# Patient Record
Sex: Male | Born: 1995
Health system: Southern US, Community
[De-identification: ages and names within clinical notes are randomized; demographics above are authoritative.]

## PROBLEM LIST (undated history)

## (undated) ENCOUNTER — Ambulatory Visit: Source: Home / Self Care

## (undated) ENCOUNTER — Ambulatory Visit (HOSPITAL_COMMUNITY): Admission: EM | Payer: Self-pay | Source: Home / Self Care

## (undated) DIAGNOSIS — F419 Anxiety disorder, unspecified: Secondary | ICD-10-CM

## (undated) DIAGNOSIS — F909 Attention-deficit hyperactivity disorder, unspecified type: Secondary | ICD-10-CM

## (undated) DIAGNOSIS — F319 Bipolar disorder, unspecified: Secondary | ICD-10-CM

## (undated) HISTORY — PX: HIP SURGERY: SHX245

---
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
## 2015-02-09 ENCOUNTER — Emergency Department (HOSPITAL_COMMUNITY): Payer: Medicaid Other

## 2015-02-09 ENCOUNTER — Emergency Department (HOSPITAL_COMMUNITY)
Admission: EM | Admit: 2015-02-09 | Discharge: 2015-02-09 | Disposition: A | Discharge status: Home / Self Care | Payer: Medicaid Other | Attending: Emergency Medicine | Admitting: Emergency Medicine

## 2015-02-09 ENCOUNTER — Encounter (HOSPITAL_COMMUNITY): Payer: Self-pay | Admitting: *Deleted

## 2015-02-09 DIAGNOSIS — F909 Attention-deficit hyperactivity disorder, unspecified type: Secondary | ICD-10-CM | POA: Diagnosis not present

## 2015-02-09 DIAGNOSIS — S3993XA Unspecified injury of pelvis, initial encounter: Secondary | ICD-10-CM | POA: Diagnosis not present

## 2015-02-09 DIAGNOSIS — Y998 Other external cause status: Secondary | ICD-10-CM | POA: Diagnosis not present

## 2015-02-09 DIAGNOSIS — Y92410 Unspecified street and highway as the place of occurrence of the external cause: Secondary | ICD-10-CM | POA: Diagnosis not present

## 2015-02-09 DIAGNOSIS — R451 Restlessness and agitation: Secondary | ICD-10-CM | POA: Insufficient documentation

## 2015-02-09 DIAGNOSIS — S199XXA Unspecified injury of neck, initial encounter: Secondary | ICD-10-CM | POA: Diagnosis present

## 2015-02-09 DIAGNOSIS — F419 Anxiety disorder, unspecified: Secondary | ICD-10-CM | POA: Diagnosis not present

## 2015-02-09 DIAGNOSIS — Z79899 Other long term (current) drug therapy: Secondary | ICD-10-CM | POA: Diagnosis not present

## 2015-02-09 DIAGNOSIS — Y9389 Activity, other specified: Secondary | ICD-10-CM | POA: Diagnosis not present

## 2015-02-09 DIAGNOSIS — F17210 Nicotine dependence, cigarettes, uncomplicated: Secondary | ICD-10-CM | POA: Diagnosis not present

## 2015-02-09 DIAGNOSIS — S161XXA Strain of muscle, fascia and tendon at neck level, initial encounter: Secondary | ICD-10-CM | POA: Diagnosis not present

## 2015-02-09 MED ORDER — NAPROXEN 500 MG PO TABS: ORAL_TABLET | ORAL | Status: DC

## 2015-02-09 MED ORDER — ONDANSETRON 4 MG PO TBDP
4.0000 mg | ORAL_TABLET | Freq: Once | ORAL | Status: AC
  Administered 2015-02-09: 4 mg via ORAL
  Filled 2015-02-09: qty 1

## 2015-02-09 MED ORDER — HYDROXYZINE HCL 25 MG PO TABS: 25.0000 mg | ORAL_TABLET | Freq: Three times a day (TID) | ORAL | Status: DC | PRN

## 2015-02-09 MED ORDER — HYDROXYZINE HCL 25 MG PO TABS
25.0000 mg | ORAL_TABLET | Freq: Once | ORAL | Status: AC
  Administered 2015-02-09: 25 mg via ORAL
  Filled 2015-02-09: qty 1

## 2015-02-09 NOTE — Discharge Instructions (Signed)
Follow up as needed
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
## 2016-04-14 ENCOUNTER — Emergency Department (HOSPITAL_COMMUNITY)
Admission: EM | Admit: 2016-04-14 | Discharge: 2016-04-14 | Disposition: A | Discharge status: Home / Self Care | Payer: Medicaid Other | Attending: Emergency Medicine | Admitting: Emergency Medicine

## 2016-04-14 ENCOUNTER — Encounter (HOSPITAL_COMMUNITY): Payer: Self-pay | Admitting: Emergency Medicine

## 2016-04-14 DIAGNOSIS — F909 Attention-deficit hyperactivity disorder, unspecified type: Secondary | ICD-10-CM | POA: Insufficient documentation

## 2016-04-14 DIAGNOSIS — R519 Headache, unspecified: Secondary | ICD-10-CM

## 2016-04-14 DIAGNOSIS — R110 Nausea: Secondary | ICD-10-CM

## 2016-04-14 DIAGNOSIS — F17210 Nicotine dependence, cigarettes, uncomplicated: Secondary | ICD-10-CM | POA: Insufficient documentation

## 2016-04-14 DIAGNOSIS — J45909 Unspecified asthma, uncomplicated: Secondary | ICD-10-CM | POA: Insufficient documentation

## 2016-04-14 DIAGNOSIS — Z79899 Other long term (current) drug therapy: Secondary | ICD-10-CM | POA: Insufficient documentation

## 2016-04-14 DIAGNOSIS — R51 Headache: Secondary | ICD-10-CM | POA: Insufficient documentation

## 2016-04-14 HISTORY — DX: Anxiety disorder, unspecified: F41.9

## 2016-04-14 MED ORDER — KETOROLAC TROMETHAMINE 15 MG/ML IJ SOLN
30.0000 mg | Freq: Once | INTRAMUSCULAR | Status: AC
  Administered 2016-04-14: 30 mg via INTRAMUSCULAR
  Filled 2016-04-14: qty 2

## 2016-04-14 MED ORDER — METOCLOPRAMIDE HCL 10 MG PO TABS
10.0000 mg | ORAL_TABLET | Freq: Once | ORAL | Status: AC
  Administered 2016-04-14: 10 mg via ORAL
  Filled 2016-04-14: qty 1

## 2016-04-14 MED ORDER — ONDANSETRON HCL 4 MG PO TABS: 4.0000 mg | ORAL_TABLET | Freq: Four times a day (QID) | ORAL | 0 refills | Status: DC

## 2016-04-14 MED ORDER — ALBUTEROL SULFATE HFA 108 (90 BASE) MCG/ACT IN AERS
1.0000 | INHALATION_SPRAY | RESPIRATORY_TRACT | Status: DC | PRN
  Administered 2016-04-14: 2 via RESPIRATORY_TRACT
  Filled 2016-04-14: qty 6.7

## 2016-04-14 MED ORDER — DIPHENHYDRAMINE HCL 25 MG PO CAPS
25.0000 mg | ORAL_CAPSULE | Freq: Once | ORAL | Status: AC
  Administered 2016-04-14: 25 mg via ORAL
  Filled 2016-04-14: qty 1

## 2016-04-14 NOTE — ED Provider Notes (Signed)
[facility] DEPT Provider Note   CSN: [id number] Arrival date & time: 04/14/16  1504  By signing my name below, I, Zachary [name], attest that this documentation has been prepared under the direction and in the presence of [employer], PA-C. Electronically Signed: Sonum [name], Education administrator. 04/14/16. 4:02 PM.  History   Chief Complaint Chief Complaint  Patient presents with  . Headache  . Nausea    The history is provided by the patient. No language interpreter was used.     HPI Comments: Zachary [name] is a 21 y.o. male who presents to the Emergency Department complaining of a constant, unchanged frontal and left sided HA that began after waking this morning. He has associated nausea and a mild cough that started a few days ago. He reports history of similar headaches but states the last one was over 1 year ago. He denies sick contacts. He denies numbness, paresthesia, fever, vomiting, congestion, abdominal pain, diarrhea.   Past Medical History:  Diagnosis Date  . Anxiety   . 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   Past Surgical History:  Procedure Laterality Date  . HIP SURGERY         Home Medications    Prior to Admission medications   Medication Sig Start Date End Date Taking? Authorizing Provider  fexofenadine (ALLEGRA) 180 MG tablet Take 1 tablet (180 mg total) by mouth daily. Patient not taking: Reported on 02/09/2015 06/05/12   Billy Fischer, MD  hydrOXYzine (ATARAX/VISTARIL) 25 MG tablet Take 1 tablet (25 mg total) by mouth every 8 (eight) hours as needed for anxiety. 02/09/15   Milton Ferguson, MD  methylphenidate (CONCERTA) 36 MG CR tablet Take 36 mg by mouth every morning.      Historical Provider, MD  naproxen (NAPROSYN) 500 MG tablet Take one every 12 hours for pain 02/09/15   Milton Ferguson, MD  ondansetron (ZOFRAN) 4 MG tablet Take 1 tablet (4 mg total) by mouth every 6 (six)  hours. 04/14/16   Okey Regal, PA-C    Family History History reviewed. No pertinent family history.  Social History Social History  Substance Use Topics  . Smoking status: Current Some Day Smoker    Packs/day: 1.00    Types: Cigarettes  . Smokeless tobacco: Not on file  . Alcohol use Yes     Allergies   Patient has no known allergies.   Review of Systems Review of Systems  Constitutional: Negative for fever.  HENT: Negative for congestion.   Gastrointestinal: Positive for nausea. Negative for abdominal pain, diarrhea and vomiting.  Neurological: Positive for headaches. Negative for weakness and numbness.     Physical Exam Updated Vital Signs BP 126/71 (BP Location: Right Arm)   Pulse (!) 56   Temp 97.6 F (36.4 C) (Oral)   Resp 20   Wt 89.4 kg   SpO2 98%   BMI 29.09 kg/m   Physical Exam  Constitutional: He is oriented to person, place, and time. He appears well-developed and well-nourished. No distress.  HENT:  Head: Normocephalic.  Eyes: Conjunctivae and EOM are normal. Pupils are equal, round, and reactive to light. Right eye exhibits no discharge. Left eye exhibits no discharge. No scleral icterus.  Neck: Normal range of motion. Neck supple. No JVD present.  Cardiovascular: Normal rate.   Pulmonary/Chest: Effort normal and breath sounds normal. No stridor. No respiratory distress. He has no wheezes. He has no rales.  Abdominal: Soft. He exhibits no distension. There is no tenderness.  Musculoskeletal: Normal range of motion. He exhibits no edema or tenderness.  Lymphadenopathy:    He has no cervical adenopathy.  Neurological: He is alert and oriented to person, place, and time. He has normal strength. He displays no atrophy and no tremor. No cranial nerve deficit or sensory deficit. He exhibits normal muscle tone. He displays a negative Romberg sign. He displays no seizure activity. Coordination and gait normal. GCS eye subscore is 4. GCS verbal subscore is  5. GCS motor subscore is 6.  Reflex Scores:      Patellar reflexes are 2+ on the right side and 2+ on the left side. Skin: He is not diaphoretic.  Nursing note and vitals reviewed.    ED Treatments / Results  DIAGNOSTIC STUDIES: Oxygen Saturation is 100% on RA, normal by my interpretation.    COORDINATION OF CARE: 3:46 PM Discussed treatment plan with pt at bedside and pt agreed to plan.   Labs (all labs ordered are listed, but only abnormal results are displayed) Labs Reviewed - No data to display  EKG  EKG Interpretation None       Radiology No results found.  Procedures Procedures (including critical care time)  Medications Ordered in ED Medications  ketorolac (TORADOL) 15 MG/ML injection 30 mg (30 mg Intramuscular Given 04/14/16 1608)  metoCLOPramide (REGLAN) tablet 10 mg (10 mg Oral Given 04/14/16 1628)  diphenhydrAMINE (BENADRYL) capsule 25 mg (25 mg Oral Given 04/14/16 1608)     Initial Impression / Assessment and Plan / ED Course  I have reviewed the triage vital signs and the nursing notes.  Pertinent labs & imaging results that were available during my care of the patient were reviewed by me and considered in my medical decision making (see chart for details).     Patient presents with headache and nausea.  He is afebrile nontoxic, normal neuro exam, no red flags for headache here.  He was treated with near complete symptom resolution.  Patient will be discharged home with primary care follow-up if symptoms continue to persist.  Patient given return precautions in the event of new or worsening signs or symptoms.  He verbalized understanding and agreement to today's plan.  Patient also notes a history of asthma, no acute asthma symptoms here today, requesting albuterol as he does not have access to the prescription.  Inhaler given here in the ED  Final Clinical Impressions(s) / ED Diagnoses   Final diagnoses:  Nonintractable headache, unspecified chronicity  pattern, unspecified headache type  Nausea  Uncomplicated asthma, unspecified asthma severity, unspecified whether persistent    New Prescriptions Discharge Medication List as of 04/14/2016  5:01 PM    START taking these medications   Details  ondansetron (ZOFRAN) 4 MG tablet Take 1 tablet (4 mg total) by mouth every 6 (six) hours., Starting Sat 04/14/2016, Print       I personally performed the services described in this documentation, which was scribed in my presence. The recorded information has been reviewed and is accurate.    Okey Regal, PA-C 04/14/16 2025    Drenda Freeze, MD 04/14/16 [phone]

## 2016-04-14 NOTE — ED Triage Notes (Signed)
Per EMS-pt called for transport with c/o headache , nausea. Denies fever. Denies blurred vision

## 2016-04-14 NOTE — ED Triage Notes (Signed)
Pt reports nausea, headache and anxiety since this morning. Did not self medicate

## 2016-04-14 NOTE — Discharge Instructions (Signed)
Please read attached information. If you experience any new or worsening signs or symptoms please return to the emergency room for evaluation. Please follow-up with your primary care provider or specialist as discussed. Please use medication prescribed only as directed and discontinue taking if you have any concerning signs or symptoms.   °

## 2016-06-01 ENCOUNTER — Emergency Department (HOSPITAL_COMMUNITY): Payer: Self-pay

## 2016-06-01 ENCOUNTER — Emergency Department (HOSPITAL_COMMUNITY)
Admission: EM | Admit: 2016-06-01 | Discharge: 2016-06-01 | Disposition: A | Discharge status: Home / Self Care | Payer: Self-pay | Attending: Emergency Medicine | Admitting: Emergency Medicine

## 2016-06-01 ENCOUNTER — Encounter (HOSPITAL_COMMUNITY): Payer: Self-pay | Admitting: Vascular Surgery

## 2016-06-01 DIAGNOSIS — F17210 Nicotine dependence, cigarettes, uncomplicated: Secondary | ICD-10-CM | POA: Insufficient documentation

## 2016-06-01 DIAGNOSIS — R05 Cough: Secondary | ICD-10-CM

## 2016-06-01 DIAGNOSIS — R059 Cough, unspecified: Secondary | ICD-10-CM

## 2016-06-01 DIAGNOSIS — B009 Herpesviral infection, unspecified: Secondary | ICD-10-CM | POA: Insufficient documentation

## 2016-06-01 DIAGNOSIS — Z79899 Other long term (current) drug therapy: Secondary | ICD-10-CM | POA: Insufficient documentation

## 2016-06-01 LAB — URINALYSIS, ROUTINE W REFLEX MICROSCOPIC
Bacteria, UA: NONE SEEN
Bilirubin Urine: NEGATIVE
Glucose, UA: NEGATIVE mg/dL
Hgb urine dipstick: NEGATIVE
Ketones, ur: NEGATIVE mg/dL
Nitrite: NEGATIVE
Protein, ur: NEGATIVE mg/dL
Specific Gravity, Urine: 1.021 (ref 1.005–1.030)
Squamous Epithelial / LPF: NONE SEEN
pH: 6 (ref 5.0–8.0)

## 2016-06-01 MED ORDER — STERILE WATER FOR INJECTION IJ SOLN
INTRAMUSCULAR | Status: AC
  Filled 2016-06-01: qty 10

## 2016-06-01 MED ORDER — AZITHROMYCIN 250 MG PO TABS
1000.0000 mg | ORAL_TABLET | Freq: Once | ORAL | Status: AC
  Administered 2016-06-01: 1000 mg via ORAL
  Filled 2016-06-01: qty 4

## 2016-06-01 MED ORDER — CEFTRIAXONE SODIUM 250 MG IJ SOLR
250.0000 mg | Freq: Once | INTRAMUSCULAR | Status: AC
  Administered 2016-06-01: 250 mg via INTRAMUSCULAR
  Filled 2016-06-01: qty 250

## 2016-06-01 MED ORDER — VALACYCLOVIR HCL 500 MG PO TABS
1000.0000 mg | ORAL_TABLET | Freq: Two times a day (BID) | ORAL | Status: DC
  Administered 2016-06-01: 1000 mg via ORAL
  Filled 2016-06-01 (×2): qty 2

## 2016-06-01 MED ORDER — VALACYCLOVIR HCL 1 G PO TABS: 1000.0000 mg | ORAL_TABLET | Freq: Three times a day (TID) | ORAL | 0 refills | Status: DC

## 2016-06-01 MED ORDER — BENZONATATE 100 MG PO CAPS: 100.0000 mg | ORAL_CAPSULE | Freq: Three times a day (TID) | ORAL | 0 refills | Status: DC

## 2016-06-01 MED ORDER — ALBUTEROL SULFATE HFA 108 (90 BASE) MCG/ACT IN AERS
1.0000 | INHALATION_SPRAY | Freq: Once | RESPIRATORY_TRACT | Status: AC
  Administered 2016-06-01: 2 via RESPIRATORY_TRACT
  Filled 2016-06-01: qty 6.7

## 2016-06-01 MED FILL — ?VALACYCLOVIR HCL 1 GRAM TA: 1 | 10 days supply | Qty: 30 | Fill #0

## 2016-06-01 MED FILL — BENZONATATE 100 MG CAPSULE: 100 | 7 days supply | Qty: 21 | Fill #0

## 2016-06-01 NOTE — ED Notes (Signed)
Pt given guaze to cover wounds.

## 2016-06-01 NOTE — ED Triage Notes (Signed)
Pt reports to the ED for eval of multiple complaints. Pt reports he has had a cold with a productive green cough. He has hx of asthma and cannot afford his inhaler. He also reports a rash to the perineal area. He reports multiple partner with unprotected sexual intercourse. He also has some lymph node swelling to the groin.

## 2016-06-01 NOTE — Discharge Instructions (Signed)
Medications: Valtrex, Tessalon  Treatment: Take Valtrex twice daily for 10 days. Take Tessalon every 8 hours as needed for cough. You have already been treated for gonorrhea and Chlamydia. If you test positive for HIV or syphilis follow-up at the health department for treatment. If positive for any sexual transmitted disease, please tell all of your sexual partners that they will need to be treated as well.  Follow-up: Please follow-up with Dr. Veverly Fells for further evaluation and treatment of your hip pain.

## 2016-06-01 NOTE — ED Notes (Signed)
Patient transported to X-ray 

## 2016-06-01 NOTE — ED Provider Notes (Signed)
[facility] DEPT [facility] Provider Note   CSN: [id number] Arrival date & time: 06/01/16  5170  By signing my name below, I, [name], attest that this documentation has been prepared under the direction and in the presence of [employer] . Electronically Signed: [name], Education administrator. 06/01/16. 9:56 AM.  History   Chief Complaint Chief Complaint  Patient presents with  . Cough    The history is provided by the patient. No language interpreter was used.     HPI Comments: Zachary [name] is a 21 y.o. male with past medical history of asthma who presents to the Emergency Department complaining of a persistent cough that has been ongoing for the past 3 weeks. He states the cough is productive of green sputum. He has associated rhinorrhea and sore throat for nearly the same period of time. Patient states his symptoms come when he goes outside. He has not tried any OTC medications for his symptoms aside from an inhaler which he has run out of. He denies fever, CP, chest tightness, abdominal pain, vomiting, diarrhea. He has not been evaluated for these symptoms.   He also complains of persistent penile irritation and sores for the past few weeks. He states his boxers worsen the irritation. He notes having unprotected intercourse with multiple partners but started wearing a condom when the irritation began. He denies dysuria, penile discharge.   He further complains of intermittent bilateral hip pain that has been ongoing for the last few months. He states the pain is present only when walking. He notes having hip surgery as a child. Per medical record, patient had bilateral SCFE repair.   Past Medical History:  Diagnosis Date  . Anxiety   . 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   Past Surgical History:  Procedure Laterality Date  . HIP SURGERY         Home Medications    Prior to Admission  medications   Medication Sig Start Date End Date Taking? Authorizing Provider  benzonatate (TESSALON) 100 MG capsule Take 1 capsule (100 mg total) by mouth every 8 (eight) hours. 06/01/16   Frederica Kuster, PA-C  fexofenadine (ALLEGRA) 180 MG tablet Take 1 tablet (180 mg total) by mouth daily. Patient not taking: Reported on 02/09/2015 06/05/12   Billy Fischer, MD  hydrOXYzine (ATARAX/VISTARIL) 25 MG tablet Take 1 tablet (25 mg total) by mouth every 8 (eight) hours as needed for anxiety. 02/09/15   Milton Ferguson, MD  methylphenidate (CONCERTA) 36 MG CR tablet Take 36 mg by mouth every morning.      Historical Provider, MD  naproxen (NAPROSYN) 500 MG tablet Take one every 12 hours for pain 02/09/15   Milton Ferguson, MD  ondansetron (ZOFRAN) 4 MG tablet Take 1 tablet (4 mg total) by mouth every 6 (six) hours. 04/14/16   Okey Regal, PA-C  valACYclovir (VALTREX) 1000 MG tablet Take 1 tablet (1,000 mg total) by mouth 3 (three) times daily. 06/01/16   Frederica Kuster, PA-C    Family History No family history on file.  Social History Social History  Substance Use Topics  . Smoking status: Current Some Day Smoker    Packs/day: 1.00    Types: Cigarettes  . Smokeless tobacco: Never Used  . Alcohol use Yes     Comment: every other day     Allergies   Patient has no known allergies.   Review of  Systems Review of Systems  Constitutional: Negative for fever.  HENT: Positive for rhinorrhea and sore throat.   Respiratory: Positive for cough. Negative for chest tightness and shortness of breath.   Cardiovascular: Negative for chest pain.  Gastrointestinal: Negative for abdominal pain, diarrhea and vomiting.  Genitourinary: Positive for penile pain. Negative for discharge and dysuria.  Musculoskeletal: Positive for arthralgias.     Physical Exam Updated Vital Signs BP (!) 146/82 (BP Location: Left Arm)   Pulse 63   Temp 97.7 F (36.5 C) (Oral)   Resp 16   SpO2 99%   Physical Exam    Constitutional: He appears well-developed and well-nourished. No distress.  HENT:  Head: Normocephalic and atraumatic.  Mouth/Throat: Oropharynx is clear and moist. No oropharyngeal exudate.  Eyes: Conjunctivae are normal. Pupils are equal, round, and reactive to light. Right eye exhibits no discharge. Left eye exhibits no discharge. No scleral icterus.  Neck: Normal range of motion. Neck supple. No thyromegaly present.  Cardiovascular: Normal rate, regular rhythm, normal heart sounds and intact distal pulses.  Exam reveals no gallop and no friction rub.   No murmur heard. Pulmonary/Chest: Effort normal and breath sounds normal. No stridor. No respiratory distress. He has no wheezes. He has no rales.  Abdominal: Soft. Bowel sounds are normal. He exhibits no distension. There is no tenderness. There is no rebound and no guarding.  Genitourinary: Testes normal. Circumcised. Penile tenderness present.  Genitourinary Comments: Many large (up to 2 cm), yellow, lesions with drainage; tender  Musculoskeletal: He exhibits no edema or tenderness.  No hip tenderness bilaterally   Lymphadenopathy:    He has no cervical adenopathy.  Neurological: He is alert. Coordination normal.  Skin: Skin is warm and dry. No rash noted. He is not diaphoretic. No pallor.  Psychiatric: He has a normal mood and affect.  Nursing note and vitals reviewed.    ED Treatments / Results  DIAGNOSTIC STUDIES: Oxygen Saturation is 100% on RA, nml by my interpretation.    COORDINATION OF CARE: 10:09 AM Discussed treatment plan with pt at bedside and pt agreed to plan.   Labs (all labs ordered are listed, but only abnormal results are displayed) Labs Reviewed  URINALYSIS, ROUTINE W REFLEX MICROSCOPIC - Abnormal; Notable for the following:       Result Value   Leukocytes, UA TRACE (*)    All other components within normal limits  HSV CULTURE AND TYPING  HIV ANTIBODY (ROUTINE TESTING)  RPR  GC/CHLAMYDIA PROBE AMP  ([place]) NOT AT [facility]    EKG  EKG Interpretation None       Radiology Dg Chest 2 View  Result Date: 06/01/2016 CLINICAL DATA:  Cough EXAM: CHEST  2 VIEW COMPARISON:  06/04/2012 FINDINGS: The heart size and mediastinal contours are within normal limits. Both lungs are clear. The visualized skeletal structures are unremarkable. IMPRESSION: No active cardiopulmonary disease. Electronically Signed   By: Inez Catalina M.D.   On: 06/01/2016 11:02   Dg Pelvis 1-2 Views  Result Date: 06/01/2016 CLINICAL DATA:  Intermittent hip pain EXAM: PELVIS - 1-2 VIEW COMPARISON:  02/09/2015 FINDINGS: Compression screws are noted traversing the femoral necks bilaterally. No acute fracture or dislocation is seen. The overall appearance is similar to that noted on the prior exam. IMPRESSION: Postsurgical changes without acute abnormality. Electronically Signed   By: Inez Catalina M.D.   On: 06/01/2016 11:03    Procedures Procedures (including critical care time)  Medications Ordered in ED Medications  cefTRIAXone (ROCEPHIN)  injection 250 mg (250 mg Intramuscular Given 06/01/16 1109)  azithromycin (ZITHROMAX) tablet 1,000 mg (1,000 mg Oral Given 06/01/16 1109)  albuterol (PROVENTIL HFA;VENTOLIN HFA) 108 (90 Base) MCG/ACT inhaler 1-2 puff (2 puffs Inhalation Given 06/01/16 1112)     Initial Impression / Assessment and Plan / ED Course  I have reviewed the triage vital signs and the nursing notes.  Pertinent labs & imaging results that were available during my care of the patient were reviewed by me and considered in my medical decision making (see chart for details).     Patient with probable asthma intermittently exacerbated by allergens. CXR negative. Refilled inhaler and we'll give cough suppressant. Patient treated in the ED for STI with Rocephin, azithromycin, Valtrex. Patient advised to inform and treat all sexual partners.  Pt advised on safe sex practices and understands that they have  GC/Chlamydia, HSV cultures pending and will result in 2-3 days. HIV and RPR sent. UA shows trace leukocytes. Pt encouraged to follow up at local health department for future STI checks. No concern for prostatitis or epididymitis. Considering patient's hip pain, x-ray was ordered and showed postsurgical changes without acute abnormality. Will refer to orthopedics for further evaluation. Discussed return precautions. Patient vitals stable throughout ED course and discharged in satisfactory condition. Patient also evaluated by Dr. Stark Jock who guided the patient's management and agrees with plan.   Final Clinical Impressions(s) / ED Diagnoses   Final diagnoses:  Herpes simplex infection  Cough    New Prescriptions Discharge Medication List as of 06/01/2016 12:24 PM    START taking these medications   Details  benzonatate (TESSALON) 100 MG capsule Take 1 capsule (100 mg total) by mouth every 8 (eight) hours., Starting Fri 06/01/2016, Print    valACYclovir (VALTREX) 1000 MG tablet Take 1 tablet (1,000 mg total) by mouth 3 (three) times daily., Starting Fri 06/01/2016, Print       I personally performed the services described in this documentation, which was scribed in my presence. The recorded information has been reviewed and is accurate.    [address], PA-C 06/03/16 [place], MD 06/03/16 [phone]

## 2016-06-01 NOTE — ED Notes (Signed)
Productive cough x 3 weeks. "Sores" on "privates" x 3 weeks per pt's Mom.

## 2016-06-02 LAB — HIV ANTIBODY (ROUTINE TESTING W REFLEX): HIV Screen 4th Generation wRfx: NONREACTIVE

## 2016-06-04 LAB — GC/CHLAMYDIA PROBE AMP (~~LOC~~) NOT AT ARMC
Chlamydia: POSITIVE — AB
Neisseria Gonorrhea: NEGATIVE

## 2016-06-04 LAB — HSV CULTURE AND TYPING

## 2016-06-06 LAB — RPR: RPR Ser Ql: REACTIVE — AB

## 2016-06-06 LAB — RPR, QUANT+TP ABS (REFLEX)
Rapid Plasma Reagin, Quant: 1:32 {titer} — ABNORMAL HIGH
T Pallidum Abs: POSITIVE — AB

## 2016-06-13 ENCOUNTER — Encounter (HOSPITAL_COMMUNITY): Payer: Self-pay | Admitting: Emergency Medicine

## 2016-06-13 ENCOUNTER — Ambulatory Visit (HOSPITAL_COMMUNITY): Admission: EM | Admit: 2016-06-13 | Discharge: 2016-06-13 | Discharge status: Against Medical Advice | Payer: Self-pay

## 2016-06-13 NOTE — ED Notes (Signed)
Pt reports that he has to go to work. Pt left.
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
## 2016-07-10 ENCOUNTER — Emergency Department (HOSPITAL_COMMUNITY): Payer: Self-pay

## 2016-07-10 ENCOUNTER — Encounter (HOSPITAL_COMMUNITY): Payer: Self-pay | Admitting: Emergency Medicine

## 2016-07-10 ENCOUNTER — Emergency Department (HOSPITAL_COMMUNITY)
Admission: EM | Admit: 2016-07-10 | Discharge: 2016-07-11 | Disposition: A | Discharge status: Other Acute Inpatient Hospital | Payer: Self-pay | Attending: Emergency Medicine | Admitting: Emergency Medicine

## 2016-07-10 DIAGNOSIS — Z79899 Other long term (current) drug therapy: Secondary | ICD-10-CM | POA: Insufficient documentation

## 2016-07-10 DIAGNOSIS — Z638 Other specified problems related to primary support group: Secondary | ICD-10-CM

## 2016-07-10 DIAGNOSIS — R45851 Suicidal ideations: Secondary | ICD-10-CM

## 2016-07-10 DIAGNOSIS — Z6379 Other stressful life events affecting family and household: Secondary | ICD-10-CM | POA: Insufficient documentation

## 2016-07-10 DIAGNOSIS — F321 Major depressive disorder, single episode, moderate: Secondary | ICD-10-CM | POA: Insufficient documentation

## 2016-07-10 DIAGNOSIS — F17210 Nicotine dependence, cigarettes, uncomplicated: Secondary | ICD-10-CM | POA: Insufficient documentation

## 2016-07-10 HISTORY — DX: Bipolar disorder, unspecified: F31.9

## 2016-07-10 LAB — COMPREHENSIVE METABOLIC PANEL
ALT: 26 U/L (ref 17–63)
ANION GAP: 9 (ref 5–15)
AST: 34 U/L (ref 15–41)
Albumin: 4.2 g/dL (ref 3.5–5.0)
Alkaline Phosphatase: 58 U/L (ref 38–126)
BILIRUBIN TOTAL: 0.3 mg/dL (ref 0.3–1.2)
BUN: 14 mg/dL (ref 6–20)
CALCIUM: 9.1 mg/dL (ref 8.9–10.3)
CO2: 24 mmol/L (ref 22–32)
Chloride: 107 mmol/L (ref 101–111)
Creatinine, Ser: 0.9 mg/dL (ref 0.61–1.24)
Glucose, Bld: 86 mg/dL (ref 65–99)
POTASSIUM: 3.3 mmol/L — AB (ref 3.5–5.1)
Sodium: 140 mmol/L (ref 135–145)
TOTAL PROTEIN: 7.5 g/dL (ref 6.5–8.1)

## 2016-07-10 LAB — CBC
HCT: 43.4 % (ref 39.0–52.0)
Hemoglobin: 14.3 g/dL (ref 13.0–17.0)
MCH: 26.5 pg (ref 26.0–34.0)
MCHC: 32.9 g/dL (ref 30.0–36.0)
MCV: 80.5 fL (ref 78.0–100.0)
PLATELETS: 217 10*3/uL (ref 150–400)
RBC: 5.39 MIL/uL (ref 4.22–5.81)
RDW: 14.4 % (ref 11.5–15.5)
WBC: 11.2 10*3/uL — AB (ref 4.0–10.5)

## 2016-07-10 LAB — SALICYLATE LEVEL: Salicylate Lvl: <7 mg/dL (ref 2.8–30.0)

## 2016-07-10 LAB — ETHANOL

## 2016-07-10 LAB — ACETAMINOPHEN LEVEL

## 2016-07-10 MED ORDER — ACETAMINOPHEN 325 MG PO TABS: 650.0000 mg | ORAL_TABLET | ORAL | Status: DC | PRN
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
## 2016-07-10 NOTE — Progress Notes (Addendum)
Patient has been accepted to [facility].  Patient assigned to 34-2 Accepting physician is Dr. Parke Poisson.  Representative was [place], [place].  Patient can come after 12 a.m.  Francess Mullen K. Nash Shearer, LPC-A, [facility]  Counselor 07/10/2016 9:09 PM

## 2016-07-10 NOTE — Progress Notes (Signed)
Per Patriciaann Clan NP meets inpatient criteria Allena Napoleon. Nash Shearer, LPC-A, [facility]  Counselor 07/10/2016 9:02 PM

## 2016-07-10 NOTE — ED Notes (Signed)
Bed: WA27 Expected date:  Expected time:  Means of arrival:  Comments: [employer]

## 2016-07-10 NOTE — ED Notes (Signed)
TTS assessment in progress. 
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
## 2016-07-10 NOTE — ED Notes (Signed)
Dr. Johnney Killian in to talk to patient.  Officer's filling out IVC papers.  Patient told nurse he wanted to leave, still refusing to change into paper scrubs and give up his phone.  [employer] at bedside counseling patient to change into paper scrubs.

## 2016-07-10 NOTE — ED Notes (Signed)
Pt stated "I got mad and shouldn't have."  Pt also stated "I work for [employer] been there about 6 months."  Informed pt will be going to [facility] for hand.  Pt stated "I've broken it before."
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
## 2016-07-10 NOTE — ED Notes (Signed)
Attempted to call report.  Informed to call Zachary [name], [facility] d/t bed assignment is possibly changing.  Attempted to call Zachary [name].  Zachary [name] to call back.

## 2016-07-10 NOTE — ED Notes (Signed)
PT refused blood draw.  Informed RN.

## 2016-07-11 ENCOUNTER — Encounter (HOSPITAL_COMMUNITY): Payer: Self-pay

## 2016-07-11 ENCOUNTER — Inpatient Hospital Stay (HOSPITAL_COMMUNITY)
Admission: AD | Admit: 2016-07-11 | Discharge: 2016-07-12 | DRG: 885 | Disposition: A | Discharge status: Home / Self Care | Payer: No Typology Code available for payment source | Attending: Psychiatry | Admitting: Psychiatry

## 2016-07-11 DIAGNOSIS — F1290 Cannabis use, unspecified, uncomplicated: Secondary | ICD-10-CM | POA: Diagnosis present

## 2016-07-11 DIAGNOSIS — F419 Anxiety disorder, unspecified: Secondary | ICD-10-CM | POA: Diagnosis not present

## 2016-07-11 DIAGNOSIS — Z813 Family history of other psychoactive substance abuse and dependence: Secondary | ICD-10-CM

## 2016-07-11 DIAGNOSIS — F314 Bipolar disorder, current episode depressed, severe, without psychotic features: Secondary | ICD-10-CM | POA: Diagnosis not present

## 2016-07-11 DIAGNOSIS — F411 Generalized anxiety disorder: Secondary | ICD-10-CM | POA: Diagnosis present

## 2016-07-11 DIAGNOSIS — Z818 Family history of other mental and behavioral disorders: Secondary | ICD-10-CM | POA: Diagnosis not present

## 2016-07-11 DIAGNOSIS — F3131 Bipolar disorder, current episode depressed, mild: Secondary | ICD-10-CM | POA: Diagnosis not present

## 2016-07-11 DIAGNOSIS — G4700 Insomnia, unspecified: Secondary | ICD-10-CM | POA: Diagnosis not present

## 2016-07-11 DIAGNOSIS — Z79899 Other long term (current) drug therapy: Secondary | ICD-10-CM

## 2016-07-11 DIAGNOSIS — F17210 Nicotine dependence, cigarettes, uncomplicated: Secondary | ICD-10-CM | POA: Diagnosis present

## 2016-07-11 DIAGNOSIS — F319 Bipolar disorder, unspecified: Secondary | ICD-10-CM | POA: Diagnosis present

## 2016-07-11 DIAGNOSIS — F3130 Bipolar disorder, current episode depressed, mild or moderate severity, unspecified: Secondary | ICD-10-CM | POA: Diagnosis present

## 2016-07-11 LAB — RAPID URINE DRUG SCREEN, HOSP PERFORMED
Amphetamines: NOT DETECTED
BARBITURATES: NOT DETECTED
Benzodiazepines: NOT DETECTED
COCAINE: NOT DETECTED
Opiates: NOT DETECTED
TETRAHYDROCANNABINOL: POSITIVE — AB

## 2016-07-11 MED ORDER — OLANZAPINE 10 MG PO TBDP: 10.0000 mg | ORAL_TABLET | Freq: Three times a day (TID) | ORAL | Status: DC | PRN

## 2016-07-11 MED ORDER — ZIPRASIDONE MESYLATE 20 MG IM SOLR: 20.0000 mg | INTRAMUSCULAR | Status: DC | PRN

## 2016-07-11 MED ORDER — ALUM & MAG HYDROXIDE-SIMETH 200-200-20 MG/5ML PO SUSP: 30.0000 mL | ORAL | Status: DC | PRN

## 2016-07-11 MED ORDER — POTASSIUM CHLORIDE CRYS ER 20 MEQ PO TBCR
40.0000 meq | EXTENDED_RELEASE_TABLET | Freq: Once | ORAL | Status: AC
  Administered 2016-07-11: 40 meq via ORAL
  Filled 2016-07-11: qty 2

## 2016-07-11 MED ORDER — TRAZODONE HCL 50 MG PO TABS
50.0000 mg | ORAL_TABLET | Freq: Every evening | ORAL | Status: DC | PRN
  Filled 2016-07-11: qty 7

## 2016-07-11 MED ORDER — MAGNESIUM HYDROXIDE 400 MG/5ML PO SUSP: 30.0000 mL | Freq: Every day | ORAL | Status: DC | PRN

## 2016-07-11 MED ORDER — HYDROXYZINE HCL 25 MG PO TABS
25.0000 mg | ORAL_TABLET | Freq: Three times a day (TID) | ORAL | Status: DC | PRN
  Filled 2016-07-11: qty 10

## 2016-07-11 MED ORDER — ACETAMINOPHEN 325 MG PO TABS: 650.0000 mg | ORAL_TABLET | Freq: Four times a day (QID) | ORAL | Status: DC | PRN

## 2016-07-11 MED ORDER — RISPERIDONE 1 MG PO TABS
1.0000 mg | ORAL_TABLET | Freq: Two times a day (BID) | ORAL | Status: DC
  Administered 2016-07-11 – 2016-07-12 (×2): 1 mg via ORAL
  Filled 2016-07-11 (×2): qty 1
  Filled 2016-07-11: qty 14
  Filled 2016-07-11 (×2): qty 1
  Filled 2016-07-11 (×2): qty 14
  Filled 2016-07-11: qty 1
  Filled 2016-07-11: qty 14
  Filled 2016-07-11: qty 1

## 2016-07-11 MED ORDER — LORAZEPAM 1 MG PO TABS: 1.0000 mg | ORAL_TABLET | ORAL | Status: DC | PRN

## 2016-07-11 MED ORDER — QUETIAPINE FUMARATE 50 MG PO TABS
50.0000 mg | ORAL_TABLET | Freq: Every evening | ORAL | Status: DC | PRN
  Filled 2016-07-11 (×2): qty 1

## 2016-07-11 NOTE — Tx Team (Signed)
Initial Treatment Plan 07/11/2016 4:02 AM [name] PQA:[id number]    PATIENT STRESSORS: Marital or family conflict Medication change or noncompliance   PATIENT STRENGTHS: General fund of knowledge Work skills   PATIENT IDENTIFIED PROBLEMS: "not going to answer questions I dont need to be here"  "I dont give a fuck aint got nothin to loose"                   DISCHARGE CRITERIA:  Improved stabilization in mood, thinking, and/or behavior Reduction of life-threatening or endangering symptoms to within safe limits  PRELIMINARY DISCHARGE PLAN: Attend aftercare/continuing care group Placement in alternative living arrangements  PATIENT/FAMILY INVOLVEMENT: This treatment plan has been presented to and reviewed with the patient, Zachary [name], and/or family member, .  The patient and family have been given the opportunity to ask questions and make suggestions.  Zachary Dk, RN 07/11/2016, 4:02 AM
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
## 2016-07-11 NOTE — Progress Notes (Signed)
D: Pt agitated on approach. Pt threatening to tear the place up if he cannot leave. Pt stated that he will call his boys up and they will come get him the "fuck up out of here". Pt stated that the hospital staff continues to lie to him and that's making him frustrated. Pt stated that he was told that he would come over here, see a doctor and then be discharged home. Per pt, he was then told that he would see a doctor at 8 am this morning and then be discharged. Dr. Louretta Shorten made aware that pt wanted to see him immediately. MD unable to meet with pt at pt request. Pt was assessed by Herbert Pun, NP. After pt spoke with NP., pt has remained calm and cooperative on the unit. Pt verbalized to writer that he's ready to leave so that he can return to work before he loses his job. Pt mother call and spoke to Probation officer, per pt consent. Pt mother Lysbeth Galas, stated that pt should be discharged home and that he has a place to live.  A: Orders reviewed. Orders reviewed with pt. Pt encouraged to attend groups as tolerated. One on one support provided to pt by several staff members. 15 minute checks perform for safety. Writer notified [place], DON and Ria Comment, [facility] of pt threatening behaviors.  R: Pt cooperative at this time.

## 2016-07-11 NOTE — Progress Notes (Signed)
Pt requested to be started on injections that he was taking to treat syphilis. Writer notified Herbert Pun, NP. NP will assess pt tomorrow 07/12/16 and resume meds.
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
## 2016-07-11 NOTE — [facility] Group Notes (Signed)
[place] LCSW Group Therapy 07/11/2016 1:15 PM  Type of Therapy: Group Therapy- Emotion Regulation  Participation Level: Pt invited. Stated "No, there's too many people in there". Did not attend.   Georga Kaufmann, MSW, LCSWA 07/11/2016 2:48 PM
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
## 2016-07-11 NOTE — Progress Notes (Signed)
Pt refused to sign papers and refused to allow staff to get vital signs    He said he wanted all of his stuff locked up and only consented to the skin search after the police came

## 2016-07-11 NOTE — ED Notes (Signed)
[facility] here for transport to [facility].  
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
## 2016-07-11 NOTE — Progress Notes (Signed)
Recreation Therapy Notes  Date: 07/11/16 Time: 0930 Location: [facility] Dayroom  Group Topic: Stress Management  Goal Area(s) Addresses:  Patient will verbalize importance of using healthy stress management.  Patient will identify positive emotions associated with healthy stress management.   Intervention: Stress Management  Activity :  Body Scan Meditation.  LRT introduced the stress management technique of meditation.  LRT played a meditation to allow patients to take inventory of the sensations they are feeling in their body.  Patients were to follow along as the meditation was played to fully engage in the technique.  Education:  Stress Management, Discharge Planning.   Education Outcome: Acknowledges edcuation/In group clarification offered/Needs additional education  Clinical Observations/Feedback: Pt did not attend group.   Victorino Sparrow, LRT/CTRS         Victorino Sparrow A 07/11/2016 11:28 AM

## 2016-07-12 DIAGNOSIS — F1290 Cannabis use, unspecified, uncomplicated: Secondary | ICD-10-CM

## 2016-07-12 DIAGNOSIS — F3131 Bipolar disorder, current episode depressed, mild: Secondary | ICD-10-CM

## 2016-07-12 DIAGNOSIS — F17210 Nicotine dependence, cigarettes, uncomplicated: Secondary | ICD-10-CM

## 2016-07-12 MED ORDER — TRAZODONE HCL 50 MG PO TABS: 50.0000 mg | ORAL_TABLET | Freq: Every evening | ORAL | 0 refills | Status: DC | PRN

## 2016-07-12 MED ORDER — HYDROXYZINE HCL 25 MG PO TABS: 25.0000 mg | ORAL_TABLET | Freq: Three times a day (TID) | ORAL | 0 refills | Status: DC | PRN

## 2016-07-12 MED ORDER — RISPERIDONE 1 MG PO TABS: 1.0000 mg | ORAL_TABLET | Freq: Two times a day (BID) | ORAL | 0 refills | Status: DC

## 2016-07-12 NOTE — Progress Notes (Signed)
  [facility] Adult Case Management Discharge Plan :  Will you be returning to the same living situation after discharge:  Yes,  home At discharge, do you have transportation home?: Yes,  pt's sister Do you have the ability to pay for your medications: Yes,  Medicaid  Release of information consent forms completed and submitted to medical records by CSW  Patient to Follow up at: Follow-up Information    [facility] Follow up.   Specialty:  Behavioral Health Why:  Please go for a walk-in appointment within 1-3 days of discharge to be established for outpatient services. Walk-in hours are Mon-Fri 8am-3pm. Please arrive as early as possible to be sure that you are seen. Contact information: [place] [address] [phone]           Next level of care provider has access to [place] and Suicide Prevention discussed: Yes,  SPE completed with pt's mother  Have you used any form of tobacco in the last 30 days? (Cigarettes, Smokeless Tobacco, Cigars, and/or Pipes): Yes  Has patient been referred to the Quitline?: Patient refused referral  Patient has been referred for addiction treatment: Yes  Dianah Pruett N Smart LCSW 07/12/2016, 10:13 AM
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
## 2016-07-12 NOTE — Tx Team (Signed)
Interdisciplinary Treatment and Diagnostic Plan Update  07/12/2016 Time of Session: 0930 Zachary [name] MRN: [id number]  Principal Diagnosis: Bipolar Disorder Secondary Diagnoses: Active Problems:   Bipolar affective disorder, depressed (HCC)   Current Medications:  Current Facility-Administered Medications  Medication Dose Route Frequency Provider Last Rate Last Dose  . acetaminophen (TYLENOL) tablet 650 mg  650 mg Oral Q6H PRN Laverle Hobby, PA-C      . alum & mag hydroxide-simeth (MAALOX/MYLANTA) 200-200-20 MG/5ML suspension 30 mL  30 mL Oral Q4H PRN Laverle Hobby, PA-C      . hydrOXYzine (ATARAX/VISTARIL) tablet 25 mg  25 mg Oral TID PRN Laverle Hobby, PA-C      . OLANZapine zydis (ZYPREXA) disintegrating tablet 10 mg  10 mg Oral Q8H PRN Laverle Hobby, PA-C       And  . LORazepam (ATIVAN) tablet 1 mg  1 mg Oral PRN Patriciaann Clan E, PA-C      . magnesium hydroxide (MILK OF MAGNESIA) suspension 30 mL  30 mL Oral Daily PRN Patriciaann Clan E, PA-C      . risperiDONE (RISPERDAL) tablet 1 mg  1 mg Oral BID Lindell Spar I, NP   1 mg at 07/12/16 0856  . traZODone (DESYREL) tablet 50 mg  50 mg Oral QHS PRN Lindell Spar I, NP       PTA Medications: Prescriptions Prior to Admission  Medication Sig Dispense Refill Last Dose  . benzonatate (TESSALON) 100 MG capsule Take 1 capsule (100 mg total) by mouth every 8 (eight) hours. (Patient not taking: Reported on 07/10/2016) 21 capsule 0 Completed Course at Unknown time  . valACYclovir (VALTREX) 1000 MG tablet Take 1 tablet (1,000 mg total) by mouth 3 (three) times daily. (Patient not taking: Reported on 07/10/2016) 30 tablet 0 Completed Course at Unknown time    Patient Stressors: Marital or family conflict Medication change or noncompliance  Patient Strengths: General fund of knowledge Work skills  Treatment Modalities: Medication Management, Group therapy, Case management,  1 to 1 session with clinician, Psychoeducation,  Recreational therapy.   Physician Treatment Plan for Primary Diagnosis:Bipolar Disorder Long Term Goal(s): Improvement in symptoms so as ready for discharge Improvement in symptoms so as ready for discharge   Short Term Goals: Ability to identify changes in lifestyle to reduce recurrence of condition will improve Ability to verbalize feelings will improve Ability to disclose and discuss suicidal ideas Ability to demonstrate self-control will improve Ability to identify and develop effective coping behaviors will improve Ability to maintain clinical measurements within normal limits will improve Compliance with prescribed medications will improve Ability to identify triggers associated with substance abuse/mental health issues will improve  Medication Management: Evaluate patient's response, side effects, and tolerance of medication regimen.  Therapeutic Interventions: 1 to 1 sessions, Unit Group sessions and Medication administration.  Evaluation of Outcomes: Adequate for Discharge  Physician Treatment Plan for Secondary Diagnosis: Active Problems:   Bipolar affective disorder, depressed ([place])  Long Term Goal(s): Improvement in symptoms so as ready for discharge Improvement in symptoms so as ready for discharge   Short Term Goals: Ability to identify changes in lifestyle to reduce recurrence of condition will improve Ability to verbalize feelings will improve Ability to disclose and discuss suicidal ideas Ability to demonstrate self-control will improve Ability to identify and develop effective coping behaviors will improve Ability to maintain clinical measurements within normal limits will improve Compliance with prescribed medications will improve Ability to identify triggers associated with substance abuse/mental health issues  will improve     Medication Management: Evaluate patient's response, side effects, and tolerance of medication regimen.  Therapeutic Interventions: 1  to 1 sessions, Unit Group sessions and Medication administration.  Evaluation of Outcomes: Adequate for Discharge   RN Treatment Plan for Primary Diagnosis: Bipolar Disorder Long Term Goal(s): Knowledge of disease and therapeutic regimen to maintain health will improve  Short Term Goals: Ability to remain free from injury will improve, Ability to verbalize feelings will improve and Ability to disclose and discuss suicidal ideas  Medication Management: RN will administer medications as ordered by provider, will assess and evaluate patient's response and provide education to patient for prescribed medication. RN will report any adverse and/or side effects to prescribing provider.  Therapeutic Interventions: 1 on 1 counseling sessions, Psychoeducation, Medication administration, Evaluate responses to treatment, Monitor vital signs and CBGs as ordered, Perform/monitor CIWA, COWS, AIMS and Fall Risk screenings as ordered, Perform wound care treatments as ordered.  Evaluation of Outcomes: Met   LCSW Treatment Plan for Primary Diagnosis: Bipolar Disorder Long Term Goal(s): Safe transition to appropriate next level of care at discharge, Engage patient in therapeutic group addressing interpersonal concerns.  Short Term Goals: Engage patient in aftercare planning with referrals and resources, Facilitate patient progression through stages of change regarding substance use diagnoses and concerns and Identify triggers associated with mental health/substance abuse issues  Therapeutic Interventions: Assess for all discharge needs, 1 to 1 time with Social worker, Explore available resources and support systems, Assess for adequacy in community support network, Educate family and significant other(s) on suicide prevention, Complete Psychosocial Assessment, Interpersonal group therapy.  Evaluation of Outcomes: Met   Progress in Treatment: Attending groups: No. Participating in groups: No. Taking  medication as prescribed: Yes. Toleration medication: Yes. Family/Significant other contact made: SPE completed with pt's mother  Patient understands diagnosis: Yes. Discussing patient identified problems/goals with staff: Yes. Medical problems stabilized or resolved: Yes. Denies suicidal/homicidal ideation: Yes. Issues/concerns per patient self-inventory: Yes. Other: n/a   New problem(s) identified: Pt states that he has to leave today in order to keep his job which begins at 6pm this evening.   New Short Term/Long Term Goal(s): medication stabilization; development of comprehensive mental wellness/sobriety plan.   Discharge Plan or Barriers: Pt plans to return home with his mother and will follow-up at [facility].   Reason for Continuation of Hospitalization: None   Estimated Length of Stay: discharge today   Attendees: Patient: 07/12/2016 9:05 AM  Physician: Dr Dwyane Dee MD 07/12/2016 9:05 AM  Nursing: Theodis Shove RN 07/12/2016 9:05 AM  RN Care Manager: Lars Pinks CM 07/12/2016 9:05 AM  Social Worker: Maxie Better, LCSW 07/12/2016 9:05 AM  Recreational Therapist: x 07/12/2016 9:05 AM  Other: Lindell Spar NP 07/12/2016 9:05 AM  Other:  07/12/2016 9:05 AM  Other: 07/12/2016 9:05 AM    Scribe for Treatment Team: Kimber Relic Smart, LCSW 07/12/2016 9:05 AM
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
## 2016-07-12 NOTE — Progress Notes (Signed)
Pt is calm on unit and polite.  Pt makes not threats and stats he is anxious for d/c so he does not lose his job.  Pt sts that if he does not show up for work by 6pm that he will lose his job.  Pt sts his mom told him that he would be discharged in am.  Not d/c orders in chart.  Met with pt to manage expectations.  Pt remains calm.  Pt's sister calls to speak wit this Probation officer.  Permission given by patient.  Sister sts she wants him d/c in am.  Advised sister that the d/c decision is made by the doctor and that one should be available in the am.  Pt denies SI, HI and AVH.  Pt contracts for safety.  Pt denies pain of discomfort.  Pt advised of conversation with sister.  Pt remains calm.  Pt medication compliant and contracts for safety, verbally.  Pt remains safe on unit and is room reading a magazine

## 2016-07-12 NOTE — [facility] Suicide Risk Assessment (Signed)
[facility] INPATIENT:  Family/Significant Other Suicide Prevention Education  Suicide Prevention Education:  Education Completed; Orlinda Blalock (pt's mother) [phone] has been identified by the patient as the family member/significant other with whom the patient will be residing, and identified as the person(s) who will aid the patient in the event of a mental health crisis (suicidal ideations/suicide attempt).  With written consent from the patient, the family member/significant other has been provided the following suicide prevention education, prior to the and/or following the discharge of the patient.  The suicide prevention education provided includes the following:  Suicide risk factors  Suicide prevention and interventions  National Suicide Hotline telephone number  [facility] assessment telephone number  [facility] Emergency Assistance [place] and/or Residential Mobile Crisis Unit telephone number  Request made of family/significant other to:  Remove weapons (e.g., guns, rifles, knives), all items previously/currently identified as safety concern.    Remove drugs/medications (over-the-counter, prescriptions, illicit drugs), all items previously/currently identified as a safety concern.  The family member/significant other verbalizes understanding of the suicide prevention education information provided.  The family member/significant other agrees to remove the items of safety concern listed above.  Anjani Feuerborn N Smart LCSW 07/12/2016, 10:12 AM

## 2016-07-12 NOTE — Progress Notes (Signed)
Pt. Discharged per MD orders;  PT. Currently denies any HI/SI or AVH.  Pt. Was given education regarding follow up appointments and medications by RN.  Pt. Denies any questions or concerns about the medications.  Pt. Was escorted to the search room to retrieve his belongings by RN before being discharged to the hospital lobby.  

## 2016-08-22 ENCOUNTER — Encounter (HOSPITAL_COMMUNITY): Payer: Self-pay | Admitting: Emergency Medicine

## 2016-08-22 ENCOUNTER — Emergency Department (HOSPITAL_COMMUNITY): Payer: Self-pay

## 2016-08-22 ENCOUNTER — Emergency Department (HOSPITAL_COMMUNITY)
Admission: EM | Admit: 2016-08-22 | Discharge: 2016-08-22 | Disposition: A | Discharge status: Home / Self Care | Payer: Self-pay | Attending: Emergency Medicine | Admitting: Emergency Medicine

## 2016-08-22 DIAGNOSIS — J45909 Unspecified asthma, uncomplicated: Secondary | ICD-10-CM | POA: Insufficient documentation

## 2016-08-22 DIAGNOSIS — R05 Cough: Secondary | ICD-10-CM | POA: Insufficient documentation

## 2016-08-22 DIAGNOSIS — J069 Acute upper respiratory infection, unspecified: Secondary | ICD-10-CM | POA: Insufficient documentation

## 2016-08-22 DIAGNOSIS — Z79899 Other long term (current) drug therapy: Secondary | ICD-10-CM | POA: Insufficient documentation

## 2016-08-22 DIAGNOSIS — R079 Chest pain, unspecified: Secondary | ICD-10-CM | POA: Insufficient documentation

## 2016-08-22 DIAGNOSIS — F17210 Nicotine dependence, cigarettes, uncomplicated: Secondary | ICD-10-CM | POA: Insufficient documentation

## 2016-08-22 DIAGNOSIS — R059 Cough, unspecified: Secondary | ICD-10-CM

## 2016-08-22 LAB — BASIC METABOLIC PANEL
Anion gap: 11 (ref 5–15)
BUN: 13 mg/dL (ref 6–20)
CHLORIDE: 103 mmol/L (ref 101–111)
CO2: 22 mmol/L (ref 22–32)
Calcium: 9.5 mg/dL (ref 8.9–10.3)
Creatinine, Ser: 0.95 mg/dL (ref 0.61–1.24)
GFR calc Af Amer: >60 mL/min (ref >60)
GLUCOSE: 83 mg/dL (ref 65–99)
POTASSIUM: 3.7 mmol/L (ref 3.5–5.1)
Sodium: 136 mmol/L (ref 135–145)

## 2016-08-22 LAB — CBC WITH DIFFERENTIAL/PLATELET
Basophils Absolute: 0 10*3/uL (ref 0.0–0.1)
Basophils Relative: 0 %
EOS PCT: 0 %
Eosinophils Absolute: 0 10*3/uL (ref 0.0–0.7)
HEMATOCRIT: 44.9 % (ref 39.0–52.0)
Hemoglobin: 15 g/dL (ref 13.0–17.0)
LYMPHS ABS: 1.3 10*3/uL (ref 0.7–4.0)
LYMPHS PCT: 10 %
MCH: 26.6 pg (ref 26.0–34.0)
MCHC: 33.4 g/dL (ref 30.0–36.0)
MCV: 79.8 fL (ref 78.0–100.0)
Monocytes Absolute: 1.2 10*3/uL — ABNORMAL HIGH (ref 0.1–1.0)
Monocytes Relative: 10 %
Neutro Abs: 9.7 10*3/uL — ABNORMAL HIGH (ref 1.7–7.7)
Neutrophils Relative %: 80 %
PLATELETS: 225 10*3/uL (ref 150–400)
RBC: 5.63 MIL/uL (ref 4.22–5.81)
RDW: 13.6 % (ref 11.5–15.5)
WBC: 12.2 10*3/uL — AB (ref 4.0–10.5)

## 2016-08-22 LAB — I-STAT TROPONIN, ED: Troponin i, poc: 0 ng/mL (ref 0.00–0.08)

## 2016-08-22 LAB — D-DIMER, QUANTITATIVE: D-Dimer, Quant: 0.29 ug/mL-FEU (ref 0.00–0.50)

## 2016-08-22 MED ORDER — BENZONATATE 100 MG PO CAPS: 100.0000 mg | ORAL_CAPSULE | Freq: Three times a day (TID) | ORAL | 0 refills | Status: DC

## 2016-08-22 MED ORDER — FLUTICASONE PROPIONATE 50 MCG/ACT NA SUSP: 1.0000 | Freq: Every day | NASAL | 2 refills | Status: DC

## 2016-08-22 MED ORDER — CETIRIZINE HCL 10 MG PO TABS: 10.0000 mg | ORAL_TABLET | Freq: Every day | ORAL | 0 refills | Status: DC

## 2016-08-22 NOTE — ED Triage Notes (Signed)
Pt c/o SOB, fever, chest pain, productive cough with green sputum tinged with some blood. Pt recently incarcerated x several years, released in November. Pt reports hx of annual bronchitis with same symptoms as he has currently, last episode in 2014. Did not have any episodes since 2014 due to being incarcerated and not being outside. No night sweats or weight loss.

## 2016-08-22 NOTE — ED Provider Notes (Signed)
[facility] DEPT Provider Note   CSN: [id number] Arrival date & time: 08/22/16  1208  By signing my name below, I, Zachary [name], attest that this documentation has been prepared under the direction and in the presence of Zachary Echevaria, PA-C. Electronically Signed: Marcello [name], ED Scribe. 08/22/16. 5:44 PM.  History   Chief Complaint Chief Complaint  Patient presents with  . Cough  . Fever   The history is provided by the patient. No language interpreter was used.   HPI Comments: Zachary [name] is an otherwise healthy 21 y.o. male, with a PMHx of asthma, who presents to the Emergency Department complaining of cough with associated headaches, body aches, subjective fevers, and productive "green" cough that began 4 days ago.  He also complains of "sharp" "burning" central chest pain, that is exacerbated with cough, and with being outside. The pt states that he has not tried any medications or methods to alleviate his pain. He denies a h/x of PE/DVT, recent surgeries, and cancer. He denies recent travel, that would cause him to become immobilized. The pt denies leg swelling, calf pain, abdominal pain, diarrhea, emesis, and nausea.    Past Medical History:  Diagnosis Date  . Anxiety   . 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 Bipolar disorder Zachary [name])     Patient Active Problem List   Diagnosis Date Noted  . Bipolar affective disorder, depressed ([place]) 07/11/2016    Past Surgical History:  Procedure Laterality Date  . HIP SURGERY         Home Medications    Prior to Admission medications   Medication Sig Start Date End Date Taking? Authorizing Provider  benzonatate (TESSALON) 100 MG capsule Take 1 capsule (100 mg total) by mouth every 8 (eight) hours. 08/22/16   Emeline General, PA-C  cetirizine (ZYRTEC ALLERGY) 10 MG tablet Take 1 tablet (10 mg total) by mouth daily. 08/22/16   Zachary [name] B, PA-C  fluticasone (FLONASE) 50 MCG/ACT  nasal spray Place 1 spray into both nostrils daily. 08/22/16   Zachary [name] B, PA-C  hydrOXYzine (ATARAX/VISTARIL) 25 MG tablet Take 1 tablet (25 mg total) by mouth 3 (three) times daily as needed for anxiety. 07/12/16   Lindell Spar I, NP  risperiDONE (RISPERDAL) 1 MG tablet Take 1 tablet (1 mg total) by mouth 2 (two) times daily. For mood control 07/12/16   Lindell Spar I, NP  traZODone (DESYREL) 50 MG tablet Take 1 tablet (50 mg total) by mouth at bedtime as needed for sleep. 07/12/16   Encarnacion Slates, NP    Family History History reviewed. No pertinent family history.  Social History Social History  Substance Use Topics  . Smoking status: Current Some Day Smoker    Packs/day: 1.00    Types: Cigarettes  . Smokeless tobacco: Never Used  . Alcohol use Yes     Comment: every other day     Allergies   Patient has no known allergies.   Review of Systems Review of Systems  Constitutional: Positive for fever.  Respiratory: Positive for cough.   Cardiovascular: Positive for chest pain. Negative for leg swelling.  Gastrointestinal: Negative for abdominal pain, diarrhea, nausea and vomiting.  Musculoskeletal: Positive for myalgias.  Neurological: Positive for headaches.     Physical Exam Updated Vital Signs BP (!) 154/72 (BP Location: Left Arm)   Pulse 60   Temp 98.8 F (37.1 C) (Oral)   Resp 19   SpO2 100%   Physical Exam  Constitutional:  He is oriented to person, place, and time. He appears well-developed and well-nourished. No distress.  Patient is afebrile, non-toxic appearing, sitting comfortably in chair in no acute distress.   HENT:  Head: Normocephalic.  Right Ear: Tympanic membrane normal.  Left Ear: Tympanic membrane normal.  Mouth/Throat: Uvula is midline, oropharynx is clear and moist and mucous membranes are normal. No oropharyngeal exudate.  No gross oral abscess. Uvula is midline, arches are simmetrical and intact. No peritonsilar swelling or exudate.     Eyes: Conjunctivae and EOM are normal. Right eye exhibits no discharge. Left eye exhibits no discharge.  Neck: Normal range of motion. Neck supple.  Cardiovascular: Normal rate, regular rhythm, normal heart sounds and intact distal pulses.  Exam reveals no gallop and no friction rub.   No murmur heard. Pulmonary/Chest: Effort normal and breath sounds normal. No respiratory distress. He has no wheezes. He has no rales. He exhibits tenderness.  Chest is tender to palpation at the sternal border  Abdominal: He exhibits no distension.  Musculoskeletal: Normal range of motion. He exhibits no edema, tenderness or deformity.  Lymphadenopathy:    He has cervical adenopathy.  Neurological: He is alert and oriented to person, place, and time.  Skin: Skin is warm and dry. No rash noted. He is not diaphoretic. No erythema. No pallor.  Psychiatric: He has a normal mood and affect.  Nursing note and vitals reviewed.    ED Treatments / Results   DIAGNOSTIC STUDIES: Oxygen Saturation is 98% on RA, normal by my interpretation.   COORDINATION OF CARE: 1:26 PM-Discussed next steps with pt. Pt verbalized understanding and is agreeable with the plan.   Labs (all labs ordered are listed, but only abnormal results are displayed) Labs Reviewed  CBC WITH DIFFERENTIAL/PLATELET - Abnormal; Notable for the following:       Result Value   WBC 12.2 (*)    Neutro Abs 9.7 (*)    Monocytes Absolute 1.2 (*)    All other components within normal limits  D-DIMER, QUANTITATIVE (NOT AT [facility])  BASIC METABOLIC PANEL  HIV ANTIBODY (ROUTINE TESTING)  I-STAT TROPOININ, ED    EKG  EKG Interpretation None       Radiology Dg Chest 2 View  Result Date: 08/22/2016 CLINICAL DATA:  Chest pain.  Short of breath.  Cough. EXAM: CHEST  2 VIEW COMPARISON:  06/01/2016 FINDINGS: Normal heart size. Lungs clear. No pneumothorax. No pleural effusion. IMPRESSION: No active cardiopulmonary disease. Electronically Signed    By: Marybelle Killings M.D.   On: 08/22/2016 13:28    Procedures Procedures (including critical care time)  Medications Ordered in ED Medications - No data to display   Initial Impression / Assessment and Plan / ED Course  I have reviewed the triage vital signs and the nursing notes.  Pertinent labs & imaging results that were available during my care of the patient were reviewed by me and considered in my medical decision making (see chart for details).     Patient presents with multiple complaints including chest pain with pleuritic component, fever, myalgias, productive cough, SOB.  Chest x-ray negative for acute cardiopulmonary process, troponin negative.  Heart score: 1 low suspicion for ACS in this patient PERC negative, low suspicion for PE in this patient. Dimer: negative  Pt CXR negative for acute infiltrate. Patients symptoms are consistent with URI, likely viral etiology. Discussed that antibiotics are not indicated for viral infections. Pt will be discharged with symptomatic treatment.  Verbalizes understanding and is agreeable  with plan. Pt is hemodynamically stable & in NAD prior to dc.  Discussed strict return precautions and advised to return to the emergency department if experiencing any new or worsening symptoms. Instructions were understood and patient agreed with discharge plan.  Final Clinical Impressions(s) / ED Diagnoses   Final diagnoses:  Chest pain, unspecified type  Cough  Upper respiratory tract infection, unspecified type    New Prescriptions Discharge Medication List as of 08/22/2016  5:12 PM    START taking these medications   Details  benzonatate (TESSALON) 100 MG capsule Take 1 capsule (100 mg total) by mouth every 8 (eight) hours., Starting Wed 08/22/2016, Print    cetirizine (ZYRTEC ALLERGY) 10 MG tablet Take 1 tablet (10 mg total) by mouth daily., Starting Wed 08/22/2016, Print    fluticasone (FLONASE) 50 MCG/ACT nasal spray Place 1 spray into  both nostrils daily., Starting Wed 08/22/2016, Print       I personally performed the services described in this documentation, which was scribed in my presence. The recorded information has been reviewed and is accurate.     Emeline General, PA-C 08/22/16 1745    Tanna Furry, MD 08/29/16 [phone]

## 2016-08-22 NOTE — Discharge Instructions (Addendum)
As discussed, make sure that you stay well-hydrated. Use antihistamines and nasal spray daily.  Follow up with the wellness clinic to establish care with a primary care provider. Return if symptoms worsen or you experience new concerning symptoms in the meantime.

## 2016-08-22 NOTE — ED Triage Notes (Signed)
Patient is complaining of SOB, cough, and fever. Patient states it started two days ago. Patient says his chest hurts every time he coughs.

## 2016-08-23 LAB — HIV ANTIBODY (ROUTINE TESTING W REFLEX): HIV Screen 4th Generation wRfx: NONREACTIVE

## 2017-03-12 ENCOUNTER — Other Ambulatory Visit: Payer: Self-pay

## 2017-03-12 ENCOUNTER — Emergency Department (HOSPITAL_COMMUNITY): Payer: Self-pay

## 2017-03-12 ENCOUNTER — Ambulatory Visit (HOSPITAL_COMMUNITY)
Admission: EM | Admit: 2017-03-12 | Discharge: 2017-03-12 | Disposition: A | Discharge status: Other Acute Inpatient Hospital | Payer: Self-pay

## 2017-03-12 ENCOUNTER — Encounter (HOSPITAL_COMMUNITY): Payer: Self-pay | Admitting: *Deleted

## 2017-03-12 ENCOUNTER — Emergency Department (HOSPITAL_COMMUNITY)
Admission: EM | Admit: 2017-03-12 | Discharge: 2017-03-12 | Disposition: A | Discharge status: Home / Self Care | Payer: Self-pay | Attending: Emergency Medicine | Admitting: Emergency Medicine

## 2017-03-12 DIAGNOSIS — G8918 Other acute postprocedural pain: Secondary | ICD-10-CM | POA: Insufficient documentation

## 2017-03-12 DIAGNOSIS — F17210 Nicotine dependence, cigarettes, uncomplicated: Secondary | ICD-10-CM | POA: Insufficient documentation

## 2017-03-12 DIAGNOSIS — F909 Attention-deficit hyperactivity disorder, unspecified type: Secondary | ICD-10-CM | POA: Insufficient documentation

## 2017-03-12 DIAGNOSIS — M25552 Pain in left hip: Secondary | ICD-10-CM | POA: Insufficient documentation

## 2017-03-12 DIAGNOSIS — M25531 Pain in right wrist: Secondary | ICD-10-CM | POA: Insufficient documentation

## 2017-03-12 DIAGNOSIS — Z79899 Other long term (current) drug therapy: Secondary | ICD-10-CM | POA: Insufficient documentation
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
## 2017-03-12 NOTE — Discharge Instructions (Signed)
Use the crutches as needed for nonweightbearing on your left leg.  When you are at home, apply ice for 15-20 minutes up to 3-4 times a day over the left hip.  Elevate the left leg on pillows to the level of your heart to help with pain and swelling.  Take 800 mg of ibuprofen with food every 8 hours for the next 3-4 days to help with pain and swelling.  Please call Dr. Randel Pigg office to schedule a follow-up appointment for your left hip pain.   Wear the brace on your right wrist as needed to help with pain.  You can also apply ice for 15-20 minutes up to 3-4 times a day.  If you develop any new or worsening symptoms, including weakness or numbness in the left leg, if the left hip becomes red and warm to the touch, or if you develop a fever, please return to the emergency department for re-evaluation.

## 2017-03-12 NOTE — ED Triage Notes (Signed)
Pt had pins put in both sides of hips when he was young.  Pt states over the last couple of weeks the left hip feels like it is not in place.  Pt states right wrist pain that was swollen for 3 months.  Pt states it still gives him pain

## 2017-03-12 NOTE — ED Notes (Signed)
Patient states he has to be "somewhere" at 1400, states he does not want to wait for ortho tech to deliver crutches. Patient ambulated out of department independently without difficulty.

## 2017-03-21 ENCOUNTER — Inpatient Hospital Stay (INDEPENDENT_AMBULATORY_CARE_PROVIDER_SITE_OTHER): Payer: Self-pay | Admitting: Orthopaedic Surgery

## 2017-03-25 ENCOUNTER — Inpatient Hospital Stay (INDEPENDENT_AMBULATORY_CARE_PROVIDER_SITE_OTHER): Payer: Self-pay | Admitting: Orthopaedic Surgery

## 2017-04-08 ENCOUNTER — Ambulatory Visit (INDEPENDENT_AMBULATORY_CARE_PROVIDER_SITE_OTHER): Payer: Self-pay | Admitting: Orthopaedic Surgery

## 2017-04-08 ENCOUNTER — Encounter (INDEPENDENT_AMBULATORY_CARE_PROVIDER_SITE_OTHER): Payer: Self-pay | Admitting: Orthopaedic Surgery

## 2017-04-08 DIAGNOSIS — M1612 Unilateral primary osteoarthritis, left hip: Secondary | ICD-10-CM

## 2017-04-08 DIAGNOSIS — M25531 Pain in right wrist: Secondary | ICD-10-CM

## 2017-04-08 DIAGNOSIS — M1611 Unilateral primary osteoarthritis, right hip: Secondary | ICD-10-CM

## 2017-04-08 MED ORDER — MELOXICAM 7.5 MG PO TABS: 7.5000 mg | ORAL_TABLET | Freq: Two times a day (BID) | ORAL | 2 refills | Status: DC | PRN
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
## 2017-04-22 ENCOUNTER — Ambulatory Visit (INDEPENDENT_AMBULATORY_CARE_PROVIDER_SITE_OTHER): Payer: Self-pay | Admitting: Orthopaedic Surgery

## 2017-06-07 ENCOUNTER — Emergency Department (HOSPITAL_COMMUNITY)
Admission: EM | Admit: 2017-06-07 | Discharge: 2017-06-07 | Disposition: A | Discharge status: Home / Self Care | Payer: Self-pay | Attending: Emergency Medicine | Admitting: Emergency Medicine

## 2017-06-07 ENCOUNTER — Encounter (HOSPITAL_COMMUNITY): Payer: Self-pay

## 2017-06-07 ENCOUNTER — Other Ambulatory Visit: Payer: Self-pay

## 2017-06-07 DIAGNOSIS — Z79899 Other long term (current) drug therapy: Secondary | ICD-10-CM | POA: Insufficient documentation

## 2017-06-07 DIAGNOSIS — F909 Attention-deficit hyperactivity disorder, unspecified type: Secondary | ICD-10-CM | POA: Insufficient documentation

## 2017-06-07 DIAGNOSIS — F17210 Nicotine dependence, cigarettes, uncomplicated: Secondary | ICD-10-CM | POA: Insufficient documentation

## 2017-06-07 DIAGNOSIS — L237 Allergic contact dermatitis due to plants, except food: Secondary | ICD-10-CM | POA: Insufficient documentation

## 2017-06-07 MED ORDER — PREDNISONE 10 MG (21) PO TBPK: ORAL_TABLET | Freq: Every day | ORAL | 0 refills | Status: DC

## 2017-06-07 MED ORDER — HYDROCORTISONE 2.5 % EX LOTN: TOPICAL_LOTION | Freq: Two times a day (BID) | CUTANEOUS | 0 refills | Status: DC

## 2017-06-07 NOTE — ED Triage Notes (Signed)
Pt states that he has poison ivy and has been using lotion that has now resolved, pt wants a work note

## 2017-06-07 NOTE — Discharge Instructions (Signed)
Apply hydrocortisone cream twice daily on your arms and legs.  Avoid your face.  Take prednisone until completed.  Please return the emergency department if you develop any new or worsening symptoms including fever or pain of the rash.

## 2017-06-07 NOTE — ED Notes (Signed)
See EDP note for full assessment

## 2017-06-07 NOTE — ED Notes (Signed)
Pt ambulatory to room with steady gait

## 2017-06-07 NOTE — ED Notes (Signed)
Updated on wait for treatment room. 

## 2017-06-08 NOTE — ED Provider Notes (Signed)
[facility] DEPART[facility] Provider Note   CSN: [id number] Arrival date & time: 06/07/17  1940     History   Chief Complaint Chief Complaint  Patient presents with  . Rash    HPI Zachary [name] is a 22 y.o. male with history of bipolar disorder, ADHD who presents with a one-week history of rash to his arms and legs.  He reports he was in an area with a poison ivy.  He has been using over-the-counter spray which is improved a little bit, however still has itching and the rash has not resolved.  He reports he initially had a rash on his face, but this is resolved.  He has a small area of itching on his ear.  He denies any shortness of breath or any other symptoms.  HPI  Past Medical History:  Diagnosis Date  . Anxiety   . 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 Bipolar disorder [facility])     Patient Active Problem List   Diagnosis Date Noted  . Bipolar affective disorder, depressed ([place]) 07/11/2016    Past Surgical History:  Procedure Laterality Date  . HIP SURGERY          Home Medications    Prior to Admission medications   Medication Sig Start Date End Date Taking? Authorizing Provider  benzonatate (TESSALON) 100 MG capsule Take 1 capsule (100 mg total) by mouth every 8 (eight) hours. 08/22/16   Emeline General, PA-C  cetirizine (ZYRTEC ALLERGY) 10 MG tablet Take 1 tablet (10 mg total) by mouth daily. 08/22/16   [name] B, PA-C  fluticasone (FLONASE) 50 MCG/ACT nasal spray Place 1 spray into both nostrils daily. 08/22/16   [name] B, PA-C  hydrocortisone 2.5 % lotion Apply topically 2 (two) times daily. 06/07/17   Falyn Rubel, Bea Graff, PA-C  hydrOXYzine (ATARAX/VISTARIL) 25 MG tablet Take 1 tablet (25 mg total) by mouth 3 (three) times daily as needed for anxiety. 07/12/16   Lindell Spar I, NP  meloxicam (MOBIC) 7.5 MG tablet Take 1 tablet (7.5 mg total) by mouth 2 (two) times daily as needed for pain.  04/08/17   Leandrew Koyanagi, MD  predniSONE (STERAPRED UNI-PAK 21 TAB) 10 MG (21) TBPK tablet Take by mouth daily. Take 6 tabs by mouth daily  for 2 days, then 5 tabs for 2 days, then 4 tabs for 2 days, then 3 tabs for 2 days, 2 tabs for 2 days, then 1 tab by mouth daily for 2 days 06/07/17   Frederica Kuster, PA-C  risperiDONE (RISPERDAL) 1 MG tablet Take 1 tablet (1 mg total) by mouth 2 (two) times daily. For mood control 07/12/16   Lindell Spar I, NP  traZODone (DESYREL) 50 MG tablet Take 1 tablet (50 mg total) by mouth at bedtime as needed for sleep. 07/12/16   Encarnacion Slates, NP    Family History No family history on file.  Social History Social History   Tobacco Use  . Smoking status: Current Some Day Smoker    Packs/day: 1.00    Types: Cigarettes  . Smokeless tobacco: Never Used  Substance Use Topics  . Alcohol use: Yes    Comment: every other day  . Drug use: Yes    Types: Marijuana     Allergies   Patient has no known allergies.   Review of Systems Review of Systems  Constitutional: Negative for fever.  Respiratory: Negative for shortness of breath.   Skin:  Positive for rash.     Physical Exam Updated Vital Signs BP 135/80 (BP Location: Right Arm)   Pulse 86   Temp 98 F (36.7 C) (Oral)   Resp 14   SpO2 100%   Physical Exam  Constitutional: He appears well-developed and well-nourished. No distress.  HENT:  Head: Normocephalic and atraumatic.  Right Ear: Tympanic membrane normal.  Left Ear: Tympanic membrane normal.  Mouth/Throat: Oropharynx is clear and moist. No oropharyngeal exudate.  Area on R ear that appears to be dry skin  Eyes: Pupils are equal, round, and reactive to light. Conjunctivae are normal. Right eye exhibits no discharge. Left eye exhibits no discharge. No scleral icterus.  Neck: Normal range of motion. Neck supple. No thyromegaly present.  Cardiovascular: Normal rate, regular rhythm, normal heart sounds and intact distal pulses. Exam reveals  no gallop and no friction rub.  No murmur heard. Pulmonary/Chest: Effort normal and breath sounds normal. No stridor. No respiratory distress. He has no wheezes. He has no rales.  Musculoskeletal: He exhibits no edema.  Lymphadenopathy:    He has no cervical adenopathy.  Neurological: He is alert. Coordination normal.  Skin: Skin is warm and dry. No rash noted. He is not diaphoretic. No pallor.  Several areas of erythematous rash without drainage to bilateral upper and lower extremities, nontender  Psychiatric: He has a normal mood and affect.  Nursing note and vitals reviewed.    ED Treatments / Results  Labs (all labs ordered are listed, but only abnormal results are displayed) Labs Reviewed - No data to display  EKG None  Radiology No results found.  Procedures Procedures (including critical care time)  Medications Ordered in ED Medications - No data to display   Initial Impression / Assessment and Plan / ED Course  I have reviewed the triage vital signs and the nursing notes.  Pertinent labs & imaging results that were available during my care of the patient were reviewed by me and considered in my medical decision making (see chart for details).     Patient with suspected poison ivy dermatitis.  Considering upper and lower extremities and no improvement after 1 week, will begin steroid taper.  We will also advise hydrocortisone cream.  No signs of secondary infection.  Return precautions discussed.  Patient understands and agrees with plan.  Patient vitals stable throughout ED course and discharged in satisfactory condition.  Final Clinical Impressions(s) / ED Diagnoses   Final diagnoses:  Poison ivy dermatitis    ED Discharge Orders        Ordered    predniSONE (STERAPRED UNI-PAK 21 TAB) 10 MG (21) TBPK tablet  Daily     06/07/17 2302    hydrocortisone 2.5 % lotion  2 times daily     06/07/17 2302       Frederica Kuster, PA-C 06/08/17 0158    Pattricia Boss, MD 06/08/17 2326

## 2017-10-27 ENCOUNTER — Ambulatory Visit (HOSPITAL_COMMUNITY)
Admission: RE | Admit: 2017-10-27 | Discharge: 2017-10-27 | Disposition: A | Discharge status: Home / Self Care | Payer: Self-pay | Attending: Psychiatry | Admitting: Psychiatry

## 2017-10-27 DIAGNOSIS — F1290 Cannabis use, unspecified, uncomplicated: Secondary | ICD-10-CM | POA: Insufficient documentation

## 2017-10-27 DIAGNOSIS — F319 Bipolar disorder, unspecified: Secondary | ICD-10-CM | POA: Insufficient documentation

## 2017-10-27 DIAGNOSIS — F909 Attention-deficit hyperactivity disorder, unspecified type: Secondary | ICD-10-CM | POA: Insufficient documentation

## 2017-10-27 DIAGNOSIS — F419 Anxiety disorder, unspecified: Secondary | ICD-10-CM | POA: Insufficient documentation

## 2017-10-27 NOTE — H&P (Signed)
Behavioral Health Medical Screening Exam  Zachary [name] is an 22 y.o. male. Presents with his GF and new born baby, endorsing increased anxiety and stress related to life changes. He denies any Si/SA or HI. He has been off his previous Rx and hasnt been compliant with his appointments at [facility].  Total Time spent with patient: 15 minutes  Psychiatric Specialty Exam: Physical Exam  Constitutional: He is oriented to person, place, and time. He appears well-developed and well-nourished. No distress.  HENT:  Head: Normocephalic.  Eyes: Pupils are equal, round, and reactive to light.  Respiratory: Effort normal and breath sounds normal. No respiratory distress.  Neurological: He is alert and oriented to person, place, and time. No cranial nerve deficit.  Skin: Skin is warm and dry. He is not diaphoretic.  Psychiatric: His speech is normal. His mood appears anxious. His affect is angry. He is not agitated and not aggressive. Thought content is not paranoid. Cognition and memory are normal. He expresses impulsivity. He does not exhibit a depressed mood. He expresses no homicidal and no suicidal ideation. He expresses no suicidal plans and no homicidal plans.    Review of Systems  Constitutional: Negative for chills, fever, malaise/fatigue and weight loss.  Cardiovascular: Negative for chest pain and palpitations.  Gastrointestinal: Negative for heartburn, nausea and vomiting.  Psychiatric/Behavioral: Negative for depression, hallucinations and suicidal ideas. The patient is nervous/anxious.     There were no vitals taken for this visit.There is no height or weight on file to calculate BMI.  General Appearance: Casual  Eye Contact:  Fair  Speech:  Clear and Coherent  Volume:  Normal  Mood:  Anxious and Irritable  Affect:  Congruent  Thought Process:  Goal Directed  Orientation:  Full (Time, Place, and Person)  Thought Content:  Logical  Suicidal Thoughts:  No  Homicidal Thoughts:  No   Memory:  Immediate;   Fair  Judgement:  Fair  Insight:  Fair  Psychomotor Activity:  Normal  Concentration: Concentration: Fair  Recall:  [employer] of Knowledge:Fair  Language: Fair  Akathisia:  Negative  Handed:  Right  AIMS (if indicated):     Assets:  Desire for Improvement  Sleep:       Musculoskeletal: Strength & Muscle Tone: within normal limits Gait & Station: normal Patient leans: N/A  There were no vitals taken for this visit.  Recommendations:  Based on my evaluation the patient does not appear to have an emergency medical condition.  Laverle Hobby, PA-C 10/27/2017, 10:02 PM

## 2017-10-27 NOTE — BH Assessment (Signed)
[place] Assessment Progress Note  Per Patriciaann Clan, PA pt does not meet criteria for inpt treatment. Pt has been psych cleared and provided with OPT resources to follow up with in order to establish ongoing [place] treatment. Pt agrees to f/u with OPT resources.   Lind Covert, MSW, LCSW Therapeutic Triage Specialist  [phone]

## 2017-10-27 NOTE — BH Assessment (Addendum)
Assessment Note  Zachary [name] is an 22 y.o. male who presents to [facility] voluntarily as a walk-in. Pt reports he would like to get back on his medication. Pt states he has not taken his Trazodone in "a long time" and he has been experiencing anger outbursts. Pt states "I go from 0 to 100 quick." Pt denies SI, HI, or AVH. Pt states he feels like he needs to stay at [facility] to "just get away for a few days." Pt denies any intent to self-harm or any intent to harm others. Pt states he was going to [place] for OPT services related to med management but he did not like the services. Pt states he does not have any insurance and he does not know where to go for help with his medication.   Pt states he lives with family and is currently employed. Pt states he smokes marijuana "all day, everyday." Pt reports he experiences panic attacks due to feeling stressed and overwhelmed. Pt states his last panic attack was PTA to [facility].   Pt was previously assessed by TTS in 2018 after being IVC'd for getting into a fight with his family. Pt denies any other inpt hospitalizations. Pt contracts for safety at this time .   Per Patriciaann Clan, PA pt does not meet criteria for inpt treatment. Pt has been psych cleared and provided with OPT resources to follow up with in order to establish ongoing [place] treatment. Pt agrees to f/u with OPT resources.   Diagnosis: Unspecified Anxiety disorder; Cannabis use disorder, severe   Past Medical History:  Past Medical History:  Diagnosis Date  . Anxiety   . 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 Bipolar disorder [facility])     Past Surgical History:  Procedure Laterality Date  . HIP SURGERY      Family History: No family history on file.  Social History:  reports that he has been smoking cigarettes. He has been smoking about 1.00 pack per day. He has never used smokeless tobacco. He reports that he drinks alcohol. He reports that he has current or past drug  history. Drug: Marijuana.  Additional Social History:  Alcohol / Drug Use Pain Medications: SEE MAR Prescriptions: SEE MAR Over the Counter: SEE MAR History of alcohol / drug use?: Yes Substance #1 Name of Substance 1: Cannabis 1 - Age of First Use: 12 1 - Amount (size/oz): excessive 1 - Frequency: daily 1 - Duration: ongoing 1 - Last Use / Amount: 10/27/17  CIWA:   COWS:    Allergies: No Known Allergies  Home Medications:  (Not in a hospital admission)  OB/GYN Status:  No LMP for male patient.  General Assessment Data Location of Assessment: [facility] Assessment Services TTS Assessment: In system Is this a Tele or Face-to-Face Assessment?: Face-to-Face Is this an Initial Assessment or a Re-assessment for this encounter?: Initial Assessment Patient Accompanied by:: (alone) Language Other than English: No What gender do you identify as?: Male Marital status: Single Pregnancy Status: No Living Arrangements: Parent Can pt return to current living arrangement?: Yes Admission Status: Voluntary Is patient capable of signing voluntary admission?: Yes Referral Source: Self/Family/Friend Insurance type: none  Medical Screening Exam ([place]) Medical Exam completed: Yes  Crisis Care Plan Living Arrangements: Parent Name of Psychiatrist: none Name of Therapist: none  Education Status Is patient currently in school?: No Is the patient employed, unemployed or receiving disability?: Employed  Risk to self with the past 6 months Suicidal Ideation:  No Has patient been a risk to self within the past 6 months prior to admission? : No Suicidal Intent: No Has patient had any suicidal intent within the past 6 months prior to admission? : No Is patient at risk for suicide?: No Suicidal Plan?: No Has patient had any suicidal plan within the past 6 months prior to admission? : No Access to Means: No What has been your use of drugs/alcohol within the last 12 months?: daily  cannabis use  Previous Attempts/Gestures: No Triggers for Past Attempts: None known Intentional Self Injurious Behavior: None Family Suicide History: No Recent stressful life event(s): Other (Comment)(angry outbursts ) Persecutory voices/beliefs?: No Depression: Yes Depression Symptoms: Feeling angry/irritable, Insomnia Substance abuse history and/or treatment for substance abuse?: Yes Suicide prevention information given to non-admitted patients: Not applicable  Risk to Others within the past 6 months Homicidal Ideation: No Does patient have any lifetime risk of violence toward others beyond the six months prior to admission? : Yes (comment)(pt says when people make him angry) Thoughts of Harm to Others: No Current Homicidal Intent: No Current Homicidal Plan: No Access to Homicidal Means: No History of harm to others?: No Assessment of Violence: None Noted Does patient have access to weapons?: No Criminal Charges Pending?: No Does patient have a court date: No Is patient on probation?: No  Psychosis Hallucinations: None noted Delusions: None noted  Mental Status Report Appearance/Hygiene: Unremarkable Eye Contact: Good Motor Activity: Freedom of movement Speech: Logical/coherent Level of Consciousness: Alert Mood: Anxious Affect: Anxious Anxiety Level: Panic Attacks Panic attack frequency: daily Most recent panic attack: pt reports "today" Thought Processes: Relevant, Coherent Judgement: Unimpaired Orientation: Place, Person, Time, Situation, Appropriate for developmental age Obsessive Compulsive Thoughts/Behaviors: None  Cognitive Functioning Concentration: Normal Memory: Remote Intact, Recent Intact Is patient IDD: No Insight: Good Impulse Control: Good Appetite: Fair Have you had any weight changes? : No Change Sleep: Decreased Total Hours of Sleep: 2 Vegetative Symptoms: None  ADLScreening [facility] Assessment Services) Patient's cognitive ability adequate  to safely complete daily activities?: Yes Patient able to express need for assistance with ADLs?: Yes Independently performs ADLs?: Yes (appropriate for developmental age)  Prior Inpatient Therapy Prior Inpatient Therapy: Yes Prior Therapy Dates: 2018 Prior Therapy Facilty/Provider(s): [facility] Reason for Treatment: AGGRESSIVE BEHAVIOR  Prior Outpatient Therapy Prior Outpatient Therapy: Yes Prior Therapy Dates: 2019 Prior Therapy Facilty/Provider(s): [facility] Reason for Treatment: MED MANAGEMENT  Does patient have an ACCT team?: No Does patient have Intensive In-House Services?  : No Does patient have [facility] services? : Yes Does patient have P4CC services?: No  ADL Screening (condition at time of admission) Patient's cognitive ability adequate to safely complete daily activities?: Yes Is the patient deaf or have difficulty hearing?: No Does the patient have difficulty seeing, even when wearing glasses/contacts?: No Does the patient have difficulty concentrating, remembering, or making decisions?: No Patient able to express need for assistance with ADLs?: Yes Does the patient have difficulty dressing or bathing?: No Independently performs ADLs?: Yes (appropriate for developmental age) Does the patient have difficulty walking or climbing stairs?: No Weakness of Legs: None Weakness of Arms/Hands: None  Home Assistive Devices/Equipment Home Assistive Devices/Equipment: None    Abuse/Neglect Assessment (Assessment to be complete while patient is alone) Abuse/Neglect Assessment Can Be Completed: Yes Physical Abuse: Denies Verbal Abuse: Denies Sexual Abuse: Denies Exploitation of patient/patient's resources: Denies Self-Neglect: Denies     Regulatory affairs officer (For Healthcare) Does Patient Have a Medical Advance Directive?: No Would patient like information on creating  a medical advance directive?: No - Patient declined          Disposition: Per Patriciaann Clan, PA pt does not  meet criteria for inpt treatment. Pt has been psych cleared and provided with OPT resources to follow up with in order to establish ongoing [place] treatment. Pt agrees to f/u with OPT resources.  Disposition Initial Assessment Completed for this Encounter: Yes Disposition of Patient: Discharge(PER SPENCER SIMON, PA) Patient refused recommended treatment: No Mode of transportation if patient is discharged?: Car  On Site Evaluation by:   Reviewed with Physician:    Lyanne Co 10/27/2017 10:02 PM

## 2017-11-12 ENCOUNTER — Emergency Department (HOSPITAL_COMMUNITY)
Admission: EM | Admit: 2017-11-12 | Discharge: 2017-11-13 | Disposition: A | Discharge status: Home / Self Care | Payer: Self-pay | Attending: Emergency Medicine | Admitting: Emergency Medicine

## 2017-11-12 DIAGNOSIS — F17210 Nicotine dependence, cigarettes, uncomplicated: Secondary | ICD-10-CM | POA: Insufficient documentation

## 2017-11-12 DIAGNOSIS — F3131 Bipolar disorder, current episode depressed, mild: Secondary | ICD-10-CM

## 2017-11-12 DIAGNOSIS — Y998 Other external cause status: Secondary | ICD-10-CM | POA: Insufficient documentation

## 2017-11-12 DIAGNOSIS — Y9389 Activity, other specified: Secondary | ICD-10-CM | POA: Insufficient documentation

## 2017-11-12 DIAGNOSIS — Y9289 Other specified places as the place of occurrence of the external cause: Secondary | ICD-10-CM | POA: Insufficient documentation

## 2017-11-12 DIAGNOSIS — F319 Bipolar disorder, unspecified: Secondary | ICD-10-CM | POA: Insufficient documentation

## 2017-11-12 DIAGNOSIS — X838XXA Intentional self-harm by other specified means, initial encounter: Secondary | ICD-10-CM | POA: Insufficient documentation

## 2017-11-12 DIAGNOSIS — S0083XA Contusion of other part of head, initial encounter: Secondary | ICD-10-CM | POA: Insufficient documentation

## 2017-11-12 DIAGNOSIS — Z79899 Other long term (current) drug therapy: Secondary | ICD-10-CM | POA: Insufficient documentation

## 2017-11-12 DIAGNOSIS — R456 Violent behavior: Secondary | ICD-10-CM | POA: Insufficient documentation

## 2017-11-12 LAB — CBC
HCT: 43.1 % (ref 39.0–52.0)
Hemoglobin: 14 g/dL (ref 13.0–17.0)
MCH: 26.7 pg (ref 26.0–34.0)
MCHC: 32.5 g/dL (ref 30.0–36.0)
MCV: 82.3 fL (ref 78.0–100.0)
PLATELETS: 241 10*3/uL (ref 150–400)
RBC: 5.24 MIL/uL (ref 4.22–5.81)
RDW: 13.5 % (ref 11.5–15.5)
WBC: 15 10*3/uL — AB (ref 4.0–10.5)

## 2017-11-12 LAB — COMPREHENSIVE METABOLIC PANEL
ALK PHOS: 53 U/L (ref 38–126)
ALT: 16 U/L (ref 0–44)
AST: 25 U/L (ref 15–41)
Albumin: 4.3 g/dL (ref 3.5–5.0)
Anion gap: 8 (ref 5–15)
BILIRUBIN TOTAL: 0.8 mg/dL (ref 0.3–1.2)
BUN: 13 mg/dL (ref 6–20)
CALCIUM: 9.8 mg/dL (ref 8.9–10.3)
CHLORIDE: 111 mmol/L (ref 98–111)
CO2: 26 mmol/L (ref 22–32)
CREATININE: 0.97 mg/dL (ref 0.61–1.24)
GFR calc Af Amer: >60 mL/min (ref >60)
Glucose, Bld: 87 mg/dL (ref 70–99)
Potassium: 3.4 mmol/L — ABNORMAL LOW (ref 3.5–5.1)
Sodium: 145 mmol/L (ref 135–145)
TOTAL PROTEIN: 7.3 g/dL (ref 6.5–8.1)

## 2017-11-12 LAB — ETHANOL

## 2017-11-12 MED ORDER — STERILE WATER FOR INJECTION IJ SOLN
INTRAMUSCULAR | Status: AC
  Administered 2017-11-12: 19:00:00
  Filled 2017-11-12: qty 10

## 2017-11-12 MED ORDER — LORAZEPAM 1 MG PO TABS
1.0000 mg | ORAL_TABLET | Freq: Once | ORAL | Status: DC
  Filled 2017-11-12: qty 1

## 2017-11-12 MED ORDER — ZIPRASIDONE MESYLATE 20 MG IM SOLR
20.0000 mg | Freq: Once | INTRAMUSCULAR | Status: AC
  Administered 2017-11-12: 20 mg via INTRAMUSCULAR
  Filled 2017-11-12: qty 20

## 2017-11-12 MED ORDER — LORAZEPAM 2 MG/ML IJ SOLN
2.0000 mg | Freq: Once | INTRAMUSCULAR | Status: AC
  Administered 2017-11-12: 2 mg via INTRAMUSCULAR
  Filled 2017-11-12: qty 1

## 2017-11-12 MED ORDER — POTASSIUM CHLORIDE CRYS ER 20 MEQ PO TBCR: 40.0000 meq | EXTENDED_RELEASE_TABLET | Freq: Once | ORAL | Status: DC

## 2017-11-12 MED ORDER — KETAMINE HCL 50 MG/ML IJ SOLN: 4.0000 mg/kg | Freq: Once | INTRAMUSCULAR | Status: DC

## 2017-11-12 MED ORDER — ZIPRASIDONE HCL 20 MG PO CAPS
20.0000 mg | ORAL_CAPSULE | Freq: Two times a day (BID) | ORAL | Status: DC
  Filled 2017-11-12: qty 1

## 2017-11-12 MED ORDER — LORAZEPAM 2 MG/ML IJ SOLN: 2.0000 mg | Freq: Once | INTRAMUSCULAR | Status: DC

## 2017-11-12 MED ORDER — HALOPERIDOL LACTATE 5 MG/ML IJ SOLN
5.0000 mg | Freq: Once | INTRAMUSCULAR | Status: AC
  Administered 2017-11-12: 5 mg via INTRAMUSCULAR
  Filled 2017-11-12: qty 1

## 2017-11-12 MED ORDER — DIPHENHYDRAMINE HCL 50 MG/ML IJ SOLN
25.0000 mg | Freq: Once | INTRAMUSCULAR | Status: AC
  Administered 2017-11-12: 25 mg via INTRAMUSCULAR
  Filled 2017-11-12: qty 1

## 2017-11-12 MED ORDER — ZIPRASIDONE MESYLATE 20 MG IM SOLR
10.0000 mg | Freq: Once | INTRAMUSCULAR | Status: DC
  Filled 2017-11-12: qty 20

## 2017-11-12 NOTE — ED Notes (Signed)
Bed: [facility] Expected date:  Expected time:  Means of arrival:  Comments: Seclusion room

## 2017-11-12 NOTE — ED Notes (Addendum)
Pt in room 30 with [employer] officers and [employer], in handcuffs standing next to the bed. Pt refusing to sit on the bed, screaming, he wants to go " across the street ' and see the fucking doctor now or I will fucking go off on all you's here. I can get these fucking handcuffs off."  Pt turned and slammed his head against the room wall.  Pt was helped to the bed by [employer] officers, and placed in 4 point restraints by RN Corinna Gab.  Pt still being vocal, very aggitated. This writer attempted to speak softly to patient and explain that he is only here to be medically cleared, but pt continued yelling. Dr. Ashok Cordia aware.

## 2017-11-12 NOTE — ED Provider Notes (Signed)
Patient with worsening aggressive behavior.  Patient becoming more menacing and refusing to take oral medications.  Ordered IM Geodon.  Patient proceeded to get more and more upset and actually tore a door off the wall.  Attempted to have the patient arrested for assault and instructed in a properly however apparently the judge says that they cannot discharge him while he is involuntarily committed.  Discussed possibly rescinding his IVC and letting them go as he seems to be more of a personality problem and not an acute psychotic episode however it does seem that this would likely be a danger to others because of his aggression and police have no ability to arrest him apparently.  Will keep sedated. Will defer to the morning for [facility] evaluation.   CRITICAL CARE Performed by: Merrily Pew Total critical care time: 31 minutes Critical care time was exclusive of separately billable procedures and treating other patients. Critical care was necessary to treat or prevent imminent or life-threatening deterioration. Critical care was time spent personally by me on the following activities: development of treatment plan with patient and/or surrogate as well as nursing, discussions with consultants, evaluation of patient's response to treatment, examination of patient, obtaining history from patient or surrogate, ordering and performing treatments and interventions, ordering and review of laboratory studies, ordering and review of radiographic studies, pulse oximetry and re-evaluation of patient's condition.    Merrily Pew, MD 11/12/17 [phone]

## 2017-11-12 NOTE — ED Notes (Signed)
Patient remains asleep handcuffed to bed by [employer].

## 2017-11-12 NOTE — ED Notes (Signed)
Patient continues to rage.  Medications had no effect on the patient.  Continues to threaten staff and police officers screaming at load as he can.

## 2017-11-12 NOTE — ED Notes (Signed)
Attempt to draw blood and get vitals

## 2017-11-12 NOTE — ED Triage Notes (Signed)
Family IVC'd patient and when police arrived patient became hostile beating his head on concrete.  Patient has history of bipolar disorder.  5mg  Versed given PTA by EMS. Patient arrived restrained and handcuffed.

## 2017-11-12 NOTE — ED Notes (Signed)
Bed: VI15 Expected date:  Expected time:  Means of arrival:  Comments: [employer]-IVC

## 2017-11-12 NOTE — ED Provider Notes (Signed)
[facility] DEPT Provider Note   CSN: [id number] Arrival date & time: 11/12/17  1429     History   Chief Complaint Chief Complaint  Patient presents with  . IVC, aggressive behavior    HPI Zachary [name] is a 22 y.o. male.  Patient with hx bipolar disorder presents w ivc. History non compliance w meds, threatening to harm self and family members. When ivc served, pt very upset, banged head on ground, no loc. Contusion to forehead. Pt extremely uncooperative w exam, fighting officers, screaming threats and cussing at staff - level 5 caveat.   The history is provided by the patient and the police. The history is limited by the condition of the patient.    Past Medical History:  Diagnosis Date  . Anxiety   . 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 Bipolar disorder [facility])     Patient Active Problem List   Diagnosis Date Noted  . Bipolar affective disorder, depressed ([place]) 07/11/2016    Past Surgical History:  Procedure Laterality Date  . HIP SURGERY          Home Medications    Prior to Admission medications   Medication Sig Start Date End Date Taking? Authorizing Provider  benzonatate (TESSALON) 100 MG capsule Take 1 capsule (100 mg total) by mouth every 8 (eight) hours. 08/22/16   Emeline General, PA-C  cetirizine (ZYRTEC ALLERGY) 10 MG tablet Take 1 tablet (10 mg total) by mouth daily. 08/22/16   [name] B, PA-C  fluticasone (FLONASE) 50 MCG/ACT nasal spray Place 1 spray into both nostrils daily. 08/22/16   [name] B, PA-C  hydrocortisone 2.5 % lotion Apply topically 2 (two) times daily. 06/07/17   Law, Bea Graff, PA-C  hydrOXYzine (ATARAX/VISTARIL) 25 MG tablet Take 1 tablet (25 mg total) by mouth 3 (three) times daily as needed for anxiety. 07/12/16   Lindell Spar I, NP  meloxicam (MOBIC) 7.5 MG tablet Take 1 tablet (7.5 mg total) by mouth 2 (two) times daily as needed for pain. 04/08/17    Leandrew Koyanagi, MD  predniSONE (STERAPRED UNI-PAK 21 TAB) 10 MG (21) TBPK tablet Take by mouth daily. Take 6 tabs by mouth daily  for 2 days, then 5 tabs for 2 days, then 4 tabs for 2 days, then 3 tabs for 2 days, 2 tabs for 2 days, then 1 tab by mouth daily for 2 days 06/07/17   Frederica Kuster, PA-C  risperiDONE (RISPERDAL) 1 MG tablet Take 1 tablet (1 mg total) by mouth 2 (two) times daily. For mood control 07/12/16   Lindell Spar I, NP  traZODone (DESYREL) 50 MG tablet Take 1 tablet (50 mg total) by mouth at bedtime as needed for sleep. 07/12/16   Encarnacion Slates, NP    Family History No family history on file.  Social History Social History   Tobacco Use  . Smoking status: Current Some Day Smoker    Packs/day: 1.00    Types: Cigarettes  . Smokeless tobacco: Never Used  Substance Use Topics  . Alcohol use: Yes    Comment: every other day  . Drug use: Yes    Types: Marijuana     Allergies   Patient has no known allergies.   Review of Systems Review of Systems  Unable to perform ROS: Psychiatric disorder  level 5 caveat - psychiatric illness   Physical Exam Updated Vital Signs There were no vitals taken for this visit.  Physical Exam  Constitutional: He appears well-developed and well-nourished.  HENT:  Mouth/Throat: Oropharynx is clear and moist.  Contusion to forehead. Abrasion to forehead.   Eyes: Pupils are equal, round, and reactive to light. Conjunctivae are normal.  Neck: Normal range of motion. Neck supple. No tracheal deviation present.  Cardiovascular: Normal rate, regular rhythm, normal heart sounds and intact distal pulses.  Pulmonary/Chest: Effort normal and breath sounds normal. No accessory muscle usage. No respiratory distress.  Abdominal: Soft. Bowel sounds are normal. He exhibits no distension. There is no tenderness.  Musculoskeletal: He exhibits no edema.  Neurological: He is alert.  Screaming at staff and [employer]. Moves bil extremities purposefully w  good strength.   Skin: Skin is warm and dry.  Psychiatric:  Agitated/aggressive behavior. Swearing and yelling. Uncooperative.   Nursing note and vitals reviewed.    ED Treatments / Results  Labs (all labs ordered are listed, but only abnormal results are displayed) Results for orders placed or performed during the hospital encounter of 11/12/17  Comprehensive metabolic panel  Result Value Ref Range   Sodium 145 135 - 145 mmol/L   Potassium 3.4 (L) 3.5 - 5.1 mmol/L   Chloride 111 98 - 111 mmol/L   CO2 26 22 - 32 mmol/L   Glucose, Bld 87 70 - 99 mg/dL   BUN 13 6 - 20 mg/dL   Creatinine, Ser 0.97 0.61 - 1.24 mg/dL   Calcium 9.8 8.9 - 10.3 mg/dL   Total Protein 7.3 6.5 - 8.1 g/dL   Albumin 4.3 3.5 - 5.0 g/dL   AST 25 15 - 41 U/L   ALT 16 0 - 44 U/L   Alkaline Phosphatase 53 38 - 126 U/L   Total Bilirubin 0.8 0.3 - 1.2 mg/dL   GFR calc non Af Amer >60 >60 mL/min   GFR calc Af Amer >60 >60 mL/min   Anion gap 8 5 - 15  CBC  Result Value Ref Range   WBC 15.0 (H) 4.0 - 10.5 K/uL   RBC 5.24 4.22 - 5.81 MIL/uL   Hemoglobin 14.0 13.0 - 17.0 g/dL   HCT 43.1 39.0 - 52.0 %   MCV 82.3 78.0 - 100.0 fL   MCH 26.7 26.0 - 34.0 pg   MCHC 32.5 30.0 - 36.0 g/dL   RDW 13.5 11.5 - 15.5 %   Platelets 241 150 - 400 K/uL  Ethanol  Result Value Ref Range   Alcohol, Ethyl (B) <10 <10 mg/dL    EKG None  Radiology No results found.  Procedures Procedures (including critical care time)  Medications Ordered in ED Medications  ziprasidone (GEODON) injection 10 mg (has no administration in time range)  sterile water (preservative free) injection (has no administration in time range)  LORazepam (ATIVAN) injection 2 mg (2 mg Intramuscular Given 11/12/17 1459)  haloperidol lactate (HALDOL) injection 5 mg (5 mg Intramuscular Given 11/12/17 1500)     Initial Impression / Assessment and Plan / ED Course  I have reviewed the triage vital signs and the nursing notes.  Pertinent labs & imaging  results that were available during my care of the patient were reviewed by me and considered in my medical decision making (see chart for details).  Pt given ativan initially. Remains aggressive/yelling/swearing, appears acutely psychotic. geodon im for symptom relief.   Labs sent.   Pt calmer, alert. Refuses wound care. Tetanus up to date.   [facility] team consulted. Disposition per [facility] team.   Labs reviewed - k sl low.  kcl po.    Final Clinical Impressions(s) / ED Diagnoses   Final diagnoses:  None    ED Discharge Orders    None       Lajean Saver, MD 11/12/17 [phone]

## 2017-11-12 NOTE — ED Notes (Signed)
Pt became extremely violent after speaking to someone on the phone.  He slammed the receiver down approx 10 times and began yelling.  Staff turned off the phone and patient became violent that phone was off.  He slammed the receiver down multiple times and then ripped the phone out of the wall.  When [employer] approached him he ripped the wooden door to his room off the hinges causing damage to the door frame and door.   [employer] and [facility] [employer] present, [employer] tazers drawn but not used.  Pt was able to be coaxed into the seclusion room for safety of staff and patient.  Dr aware of incident and responded.

## 2017-11-12 NOTE — ED Notes (Signed)
Bed: [facility]42 Expected date:  Expected time:  Means of arrival:  Comments: 

## 2017-11-13 ENCOUNTER — Encounter (HOSPITAL_COMMUNITY): Payer: Self-pay

## 2017-11-13 ENCOUNTER — Emergency Department (HOSPITAL_COMMUNITY)
Admission: EM | Admit: 2017-11-13 | Discharge: 2017-11-14 | Disposition: A | Discharge status: Home / Self Care | Payer: Self-pay | Attending: Emergency Medicine | Admitting: Emergency Medicine

## 2017-11-13 ENCOUNTER — Emergency Department (HOSPITAL_COMMUNITY): Payer: Self-pay

## 2017-11-13 DIAGNOSIS — F17210 Nicotine dependence, cigarettes, uncomplicated: Secondary | ICD-10-CM | POA: Insufficient documentation

## 2017-11-13 DIAGNOSIS — Z79899 Other long term (current) drug therapy: Secondary | ICD-10-CM | POA: Insufficient documentation

## 2017-11-13 DIAGNOSIS — Y9389 Activity, other specified: Secondary | ICD-10-CM | POA: Insufficient documentation

## 2017-11-13 DIAGNOSIS — M79641 Pain in right hand: Secondary | ICD-10-CM | POA: Insufficient documentation

## 2017-11-13 DIAGNOSIS — Z23 Encounter for immunization: Secondary | ICD-10-CM | POA: Insufficient documentation

## 2017-11-13 DIAGNOSIS — S0181XA Laceration without foreign body of other part of head, initial encounter: Secondary | ICD-10-CM | POA: Insufficient documentation

## 2017-11-13 DIAGNOSIS — Y92410 Unspecified street and highway as the place of occurrence of the external cause: Secondary | ICD-10-CM | POA: Insufficient documentation

## 2017-11-13 DIAGNOSIS — Y999 Unspecified external cause status: Secondary | ICD-10-CM | POA: Insufficient documentation

## 2017-11-13 DIAGNOSIS — R51 Headache: Secondary | ICD-10-CM | POA: Insufficient documentation

## 2017-11-13 DIAGNOSIS — J939 Pneumothorax, unspecified: Secondary | ICD-10-CM | POA: Insufficient documentation

## 2017-11-13 LAB — I-STAT CHEM 8, ED
BUN: 20 mg/dL (ref 6–20)
Calcium, Ion: 1.02 mmol/L — ABNORMAL LOW (ref 1.15–1.40)
Chloride: 108 mmol/L (ref 98–111)
Creatinine, Ser: 1.1 mg/dL (ref 0.61–1.24)
Glucose, Bld: 79 mg/dL (ref 70–99)
HCT: 48 % (ref 39.0–52.0)
HEMOGLOBIN: 16.3 g/dL (ref 13.0–17.0)
POTASSIUM: 5.8 mmol/L — AB (ref 3.5–5.1)
Sodium: 139 mmol/L (ref 135–145)
TCO2: 25 mmol/L (ref 22–32)

## 2017-11-13 LAB — SAMPLE TO BLOOD BANK

## 2017-11-13 LAB — I-STAT CG4 LACTIC ACID, ED: Lactic Acid, Venous: 1.54 mmol/L (ref 0.5–1.9)

## 2017-11-13 MED ORDER — TETANUS-DIPHTH-ACELL PERTUSSIS 5-2.5-18.5 LF-MCG/0.5 IM SUSP
0.5000 mL | Freq: Once | INTRAMUSCULAR | Status: AC
  Administered 2017-11-13: 0.5 mL via INTRAMUSCULAR
  Filled 2017-11-13: qty 0.5

## 2017-11-13 MED ORDER — DIPHENHYDRAMINE HCL 50 MG/ML IJ SOLN
50.0000 mg | Freq: Once | INTRAMUSCULAR | Status: AC | PRN
  Administered 2017-11-13: 50 mg via INTRAMUSCULAR
  Filled 2017-11-13: qty 1

## 2017-11-13 MED ORDER — LORAZEPAM 2 MG/ML IJ SOLN
2.0000 mg | Freq: Once | INTRAMUSCULAR | Status: AC | PRN
  Administered 2017-11-13: 2 mg via INTRAMUSCULAR
  Filled 2017-11-13: qty 1

## 2017-11-13 MED ORDER — LIDOCAINE-EPINEPHRINE (PF) 2 %-1:200000 IJ SOLN
10.0000 mL | Freq: Once | INTRAMUSCULAR | Status: AC
  Administered 2017-11-13: 10 mL via INTRADERMAL
  Filled 2017-11-13: qty 20

## 2017-11-13 MED ORDER — ZIPRASIDONE MESYLATE 20 MG IM SOLR
20.0000 mg | Freq: Once | INTRAMUSCULAR | Status: AC
  Administered 2017-11-13: 20 mg via INTRAMUSCULAR
  Filled 2017-11-13: qty 20

## 2017-11-13 MED ORDER — HYDROMORPHONE HCL 1 MG/ML IJ SOLN
1.0000 mg | Freq: Once | INTRAMUSCULAR | Status: AC
  Administered 2017-11-13: 1 mg via INTRAVENOUS
  Filled 2017-11-13: qty 1

## 2017-11-13 MED ORDER — SODIUM CHLORIDE 0.9 % IV BOLUS
1000.0000 mL | Freq: Once | INTRAVENOUS | Status: AC
  Administered 2017-11-13: 1000 mL via INTRAVENOUS

## 2017-11-13 NOTE — Progress Notes (Signed)
Pharmacy is unable to confirm the medications he was taking prior to admission. The medications on his list are prescriptions issued sometime in 2019. I called the two pharmacies they were sent to ([facility] and [facility]) but they said he hasn't picked up any medications in the past year.    Please notify pharmacy if we can be of further assistance.   Romeo Rabon, PharmD. Mobile: [phone]. 11/13/2017,9:26 AM.

## 2017-11-13 NOTE — ED Notes (Signed)
Seclusion door open patient still resting quietly. Respirations equal and unlabored, skin warm and dry. NAD noted. [employer] at nurses station. Q 15 min safety checks remain in place and video monitoring.
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
## 2017-11-13 NOTE — Discharge Instructions (Signed)
For your mental health needs, you are advised to follow up with [facility].  New and returning patients are seen at their walk-in clinic.  Walk-in hours are Monday - Friday from 8:00 am - 3:00 pm.  Walk-in patients are seen on a first come, first served basis.  Try to arrive as early as possible for he best chance of being seen the same day: ° °     [facility] °     [address] °     [place], [place] [address] °     [phone] °
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
## 2017-11-13 NOTE — ED Notes (Signed)
After receiving medications pt was able to participate in the Psychiatrist interview. He was a little drowsy. Denied SI/HI/AVH. Stated that he took the Xanax to get high and not to kill himself. Gave permission to Dr. Mariea Clonts to call his mother and/or aunt to get information.
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
## 2017-11-13 NOTE — ED Notes (Signed)
Pt is now awake, requesting to speak to doctor. Pt states "I know I broke the phone and the door off the wall, but that's what happens when I get mad and dont get my way. " Pt is verbally aggressive, pacing hallway, standing at nurses station yelling.  Pt is now demanding to speak to doctor. Continues to be verbally aggressive. Pts behavior is escalating. Security is currently speaking with pt attempting to verbally de-escalate.
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
## 2017-11-13 NOTE — ED Notes (Signed)
Patient got up and walked to his room and got in bed. Respirations equal and unlabored, skin warm and dry. NAD. Patient offered fluids and toileting patient declines. Patient refuses vital signs at this time. Patient states that he is tired and just want to sleep. Encouragement and support provided and safety maintain. Q 15 min safety checks remain in place and video monitoring.
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
## 2017-11-13 NOTE — ED Notes (Signed)
Detective spoke with pt and pt then discharged.
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
## 2017-11-13 NOTE — ED Notes (Signed)
Bed: [facility]42 Expected date:  Expected time:  Means of arrival:  Comments: 

## 2017-11-14 ENCOUNTER — Emergency Department (HOSPITAL_COMMUNITY): Payer: Self-pay

## 2017-11-14 LAB — PROTIME-INR
INR: 1.05
PROTHROMBIN TIME: 13.6 s (ref 11.4–15.2)

## 2017-11-14 LAB — COMPREHENSIVE METABOLIC PANEL
ALT: 27 U/L (ref 0–44)
ANION GAP: 12 (ref 5–15)
AST: 76 U/L — AB (ref 15–41)
Albumin: 4.4 g/dL (ref 3.5–5.0)
Alkaline Phosphatase: 56 U/L (ref 38–126)
BILIRUBIN TOTAL: 0.8 mg/dL (ref 0.3–1.2)
BUN: 13 mg/dL (ref 6–20)
CO2: 21 mmol/L — ABNORMAL LOW (ref 22–32)
Calcium: 10 mg/dL (ref 8.9–10.3)
Chloride: 107 mmol/L (ref 98–111)
Creatinine, Ser: 1.15 mg/dL (ref 0.61–1.24)
GFR calc Af Amer: >60 mL/min (ref >60)
GFR calc non Af Amer: >60 mL/min (ref >60)
Glucose, Bld: 84 mg/dL (ref 70–99)
POTASSIUM: 3.3 mmol/L — AB (ref 3.5–5.1)
Sodium: 140 mmol/L (ref 135–145)
Total Protein: 7.9 g/dL (ref 6.5–8.1)

## 2017-11-14 LAB — CBC
HEMATOCRIT: 47.3 % (ref 39.0–52.0)
Hemoglobin: 14.9 g/dL (ref 13.0–17.0)
MCH: 26 pg (ref 26.0–34.0)
MCHC: 31.5 g/dL (ref 30.0–36.0)
MCV: 82.7 fL (ref 78.0–100.0)
PLATELETS: 256 10*3/uL (ref 150–400)
RBC: 5.72 MIL/uL (ref 4.22–5.81)
RDW: 13.2 % (ref 11.5–15.5)
WBC: 17.6 10*3/uL — ABNORMAL HIGH (ref 4.0–10.5)

## 2017-11-14 LAB — CDS SEROLOGY

## 2017-11-14 LAB — ETHANOL: Alcohol, Ethyl (B): <10 mg/dL (ref <10)

## 2017-11-14 MED ORDER — HYDROCODONE-ACETAMINOPHEN 5-325 MG PO TABS
1.0000 | ORAL_TABLET | Freq: Once | ORAL | Status: AC
  Administered 2017-11-14: 1 via ORAL
  Filled 2017-11-14: qty 1

## 2017-11-14 MED ORDER — CEPHALEXIN 500 MG PO CAPS: 500.0000 mg | ORAL_CAPSULE | Freq: Three times a day (TID) | ORAL | 0 refills | Status: AC

## 2017-11-14 MED ORDER — IOHEXOL 300 MG/ML  SOLN
100.0000 mL | Freq: Once | INTRAMUSCULAR | Status: AC | PRN
  Administered 2017-11-14: 100 mL via INTRAVENOUS

## 2017-11-14 MED ORDER — ACETAMINOPHEN 500 MG PO TABS: 1000.0000 mg | ORAL_TABLET | Freq: Three times a day (TID) | ORAL | 0 refills | Status: AC

## 2017-11-14 MED ORDER — HYDROCODONE-ACETAMINOPHEN 5-325 MG PO TABS: 1.0000 | ORAL_TABLET | Freq: Three times a day (TID) | ORAL | 0 refills | Status: AC | PRN
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
## 2017-11-14 NOTE — ED Notes (Signed)
Pt returned from ct

## 2017-11-14 NOTE — ED Notes (Signed)
The pt wants his c-collar off and he wants something to drink
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
## 2017-11-14 NOTE — Progress Notes (Signed)
Orthopedic Tech Progress Note Patient Details:  Zachary [name] [birth year] [id number]  Ortho Devices Type of Ortho Device: Thumb velcro splint Ortho Device/Splint Location: rue Ortho Device/Splint Interventions: Ordered, Application, Adjustment   Post Interventions Patient Tolerated: Well Instructions Provided: Care of device, Adjustment of device   Karolee Stamps 11/14/2017, 4:03 AM

## 2018-03-05 ENCOUNTER — Telehealth (INDEPENDENT_AMBULATORY_CARE_PROVIDER_SITE_OTHER): Payer: Self-pay | Admitting: Orthopaedic Surgery

## 2018-03-05 NOTE — Telephone Encounter (Signed)
Ok we can renew

## 2018-03-05 NOTE — Telephone Encounter (Signed)
Pt called stating that Dr. 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

## 2018-03-05 NOTE — Telephone Encounter (Signed)
See message below °

## 2018-03-07 ENCOUNTER — Encounter (INDEPENDENT_AMBULATORY_CARE_PROVIDER_SITE_OTHER): Payer: Self-pay

## 2018-03-07 NOTE — Telephone Encounter (Signed)
Called patient he was unavailable and gets off work at 5 pm. Advised whoever answered the phone to call us back. Need to know what exactly needs to say on the note plus any dates if needed.   Will wait for a callback.

## 2018-03-17 NOTE — Telephone Encounter (Signed)
Patient called back in and states his note needs to say he is unable to work due to his medical dx, and has been oow since last seen due to same ongoing problems with his hip. Callback [phone] or [phone]

## 2018-03-17 NOTE — Telephone Encounter (Signed)
Okay for note

## 2018-03-17 NOTE — Telephone Encounter (Signed)
He may work a sedentary job or one that does not require any prolonged standing or sitting or physical activity.

## 2018-03-18 ENCOUNTER — Ambulatory Visit (INDEPENDENT_AMBULATORY_CARE_PROVIDER_SITE_OTHER): Payer: Self-pay | Admitting: Orthopaedic Surgery

## 2018-03-21 ENCOUNTER — Encounter (INDEPENDENT_AMBULATORY_CARE_PROVIDER_SITE_OTHER): Payer: Self-pay

## 2018-03-21 NOTE — Telephone Encounter (Signed)
Note made.  

## 2018-03-21 NOTE — Telephone Encounter (Signed)
Called and he was unavailable. Note ready for pick up at the front desk.

## 2018-03-25 ENCOUNTER — Ambulatory Visit (INDEPENDENT_AMBULATORY_CARE_PROVIDER_SITE_OTHER): Payer: Self-pay | Admitting: Orthopaedic Surgery

## 2018-11-25 ENCOUNTER — Emergency Department (HOSPITAL_COMMUNITY)
Admission: EM | Admit: 2018-11-25 | Discharge: 2018-11-25 | Disposition: A | Discharge status: Home / Self Care | Payer: Self-pay | Attending: Emergency Medicine | Admitting: Emergency Medicine

## 2018-11-25 ENCOUNTER — Encounter (HOSPITAL_COMMUNITY): Payer: Self-pay

## 2018-11-25 ENCOUNTER — Emergency Department (HOSPITAL_COMMUNITY): Payer: Self-pay

## 2018-11-25 ENCOUNTER — Other Ambulatory Visit: Payer: Self-pay

## 2018-11-25 DIAGNOSIS — S61412A Laceration without foreign body of left hand, initial encounter: Secondary | ICD-10-CM | POA: Insufficient documentation

## 2018-11-25 DIAGNOSIS — X781XXA Intentional self-harm by knife, initial encounter: Secondary | ICD-10-CM | POA: Insufficient documentation

## 2018-11-25 DIAGNOSIS — Y9389 Activity, other specified: Secondary | ICD-10-CM | POA: Insufficient documentation

## 2018-11-25 DIAGNOSIS — F17210 Nicotine dependence, cigarettes, uncomplicated: Secondary | ICD-10-CM | POA: Insufficient documentation

## 2018-11-25 DIAGNOSIS — Z79899 Other long term (current) drug therapy: Secondary | ICD-10-CM | POA: Insufficient documentation

## 2018-11-25 DIAGNOSIS — Y999 Unspecified external cause status: Secondary | ICD-10-CM | POA: Insufficient documentation

## 2018-11-25 DIAGNOSIS — S61419D Laceration without foreign body of unspecified hand, subsequent encounter: Secondary | ICD-10-CM

## 2018-11-25 DIAGNOSIS — Y929 Unspecified place or not applicable: Secondary | ICD-10-CM | POA: Insufficient documentation

## 2018-11-25 MED ORDER — SODIUM CHLORIDE 0.9 % IV SOLN
1.0000 g | Freq: Once | INTRAVENOUS | Status: AC
  Administered 2018-11-25: 1 g via INTRAVENOUS
  Filled 2018-11-25: qty 10

## 2018-11-25 MED ORDER — CEPHALEXIN 500 MG PO CAPS: 500.0000 mg | ORAL_CAPSULE | Freq: Four times a day (QID) | ORAL | 0 refills | Status: DC

## 2018-11-25 MED ORDER — LIDOCAINE HCL (PF) 1 % IJ SOLN
30.0000 mL | Freq: Once | INTRAMUSCULAR | Status: AC
  Administered 2018-11-25: 20:00:00 30 mL via INTRADERMAL
  Filled 2018-11-25: qty 30

## 2018-11-25 NOTE — ED Notes (Addendum)
Pt's aunt called the [facility] to let them know that he has been expressing thoughts of homicide and suicide today. Dr. Roderic Palau made aware but reassures this RN that based on his evaluation of pt, pt does not meet criteria for pysch treatment or IVC. Pt continues to pace the department and is asking to leave. Pt is able to be directed to his room but continues to say that he is going to sign himself out.
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
## 2018-11-25 NOTE — ED Provider Notes (Signed)
..  Laceration Repair  Date/Time: 11/25/2018 7:51 PM Performed by: Tacy Learn, PA-C Authorized by: Tacy Learn, PA-C   Consent:    Consent obtained:  Verbal   Consent given by:  Patient   Risks discussed:  Infection, need for additional repair, pain, poor cosmetic result and poor wound healing   Alternatives discussed:  No treatment and delayed treatment Universal protocol:    Procedure explained and questions answered to patient or proxy's satisfaction: yes     Relevant documents present and verified: yes     Test results available and properly labeled: yes     Imaging studies available: yes     Required blood products, implants, devices, and special equipment available: yes     Site/side marked: yes     Immediately prior to procedure, a time out was called: yes     Patient identity confirmed:  Verbally with patient Anesthesia (see MAR for exact dosages):    Anesthesia method:  Local infiltration   Local anesthetic:  Lidocaine 1% w/o epi Laceration details:    Location:  Hand   Hand location:  L palm   Length (cm):  5.5   Depth (mm):  9 Pre-procedure details:    Preparation:  Patient was prepped and draped in usual sterile fashion and imaging obtained to evaluate for foreign bodies Exploration:    Hemostasis achieved with:  Direct pressure   Wound exploration: wound explored through full range of motion and entire depth of wound probed and visualized     Wound extent: no foreign bodies/material noted and no muscle damage noted     Wound extent comment:  Concern for flexor tendon laceration of the left 5th finger, no strength against resistance, unable to fully flex digit   Contaminated: no   Treatment:    Area cleansed with:  Saline   Amount of cleaning:  Extensive   Irrigation solution:  Sterile saline Skin repair:    Repair method:  Sutures   Suture size:  4-0   Suture material:  Nylon   Suture technique:  Simple interrupted   Number of sutures:   9 Approximation:    Approximation:  Close Post-procedure details:    Dressing:  Bulky dressing   Patient tolerance of procedure:  Tolerated well, no immediate complications      Roque Lias 11/25/18 Jory Sims, MD 11/29/18 1043
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
## 2018-11-25 NOTE — ED Notes (Signed)
Suture cart at bedside 
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
## 2018-11-25 NOTE — ED Notes (Signed)
Pt verbalized discharge instructions and follow up care. Alert and ambulatory. No IV. Taking bus

## 2018-12-09 ENCOUNTER — Other Ambulatory Visit: Payer: Self-pay

## 2018-12-09 ENCOUNTER — Emergency Department (HOSPITAL_COMMUNITY)
Admission: EM | Admit: 2018-12-09 | Discharge: 2018-12-09 | Disposition: A | Discharge status: Home / Self Care | Payer: Medicaid Other | Attending: Emergency Medicine | Admitting: Emergency Medicine

## 2018-12-09 ENCOUNTER — Encounter (HOSPITAL_COMMUNITY): Payer: Self-pay | Admitting: Emergency Medicine

## 2018-12-09 DIAGNOSIS — Z5321 Procedure and treatment not carried out due to patient leaving prior to being seen by health care provider: Secondary | ICD-10-CM | POA: Insufficient documentation

## 2018-12-09 NOTE — ED Triage Notes (Signed)
Pt reports had laceration on his left hand week or so ago and ever since having pains in 4th and 5th fingers on left hand. Pt has buddy tapped and wrapped together at triage. Denies any new injuries.

## 2018-12-09 NOTE — ED Notes (Signed)
PT UP TO WINDOW AND GIVEN UPDATE ON BED STATUS. APOLOGIZED FOR WAIT. ENCOURAGED PT TO WAIT.  PT VERBALIZED UNDERSTANDING. " I GUESS I WILL WAIT." OBSERVED PT WALK OUT OF TRIAGE LOBBY.

## 2018-12-19 ENCOUNTER — Encounter (HOSPITAL_COMMUNITY): Payer: Self-pay

## 2018-12-19 ENCOUNTER — Emergency Department (HOSPITAL_COMMUNITY)
Admission: EM | Admit: 2018-12-19 | Discharge: 2018-12-19 | Disposition: A | Discharge status: Home / Self Care | Payer: Self-pay | Attending: Emergency Medicine | Admitting: Emergency Medicine

## 2018-12-19 ENCOUNTER — Emergency Department (HOSPITAL_COMMUNITY): Payer: Self-pay

## 2018-12-19 DIAGNOSIS — F17210 Nicotine dependence, cigarettes, uncomplicated: Secondary | ICD-10-CM | POA: Insufficient documentation

## 2018-12-19 DIAGNOSIS — Z79899 Other long term (current) drug therapy: Secondary | ICD-10-CM | POA: Insufficient documentation

## 2018-12-19 DIAGNOSIS — M79645 Pain in left finger(s): Secondary | ICD-10-CM | POA: Insufficient documentation

## 2018-12-19 MED ORDER — ACETAMINOPHEN 325 MG PO TABS: 650.0000 mg | ORAL_TABLET | Freq: Four times a day (QID) | ORAL | 0 refills | Status: DC | PRN

## 2018-12-19 MED ORDER — IBUPROFEN 600 MG PO TABS: 600.0000 mg | ORAL_TABLET | Freq: Four times a day (QID) | ORAL | 0 refills | Status: DC | PRN

## 2018-12-19 NOTE — Discharge Instructions (Signed)
You can take 600 mg motrin every 6 hours and 650 mg tylenol every 6 hours.  Call to make an appointment with Dr.Kuzma this week.  He is a hand specialist
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
## 2019-01-28 DIAGNOSIS — S6492XA Injury of unspecified nerve at wrist and hand level of left arm, initial encounter: Secondary | ICD-10-CM | POA: Insufficient documentation

## 2019-01-28 DIAGNOSIS — S64497A Injury of digital nerve of left little finger, initial encounter: Secondary | ICD-10-CM | POA: Insufficient documentation

## 2019-01-28 DIAGNOSIS — S66127A Laceration of flexor muscle, fascia and tendon of left little finger at wrist and hand level, initial encounter: Secondary | ICD-10-CM | POA: Insufficient documentation

## 2019-01-30 ENCOUNTER — Other Ambulatory Visit (HOSPITAL_COMMUNITY): Admission: RE | Admit: 2019-01-30 | Payer: Medicaid Other | Source: Ambulatory Visit

## 2019-01-30 ENCOUNTER — Other Ambulatory Visit: Payer: Self-pay | Admitting: Orthopedic Surgery

## 2019-01-30 ENCOUNTER — Encounter (HOSPITAL_BASED_OUTPATIENT_CLINIC_OR_DEPARTMENT_OTHER): Payer: Self-pay | Admitting: Orthopedic Surgery

## 2019-01-30 NOTE — Progress Notes (Signed)

## 2019-02-02 ENCOUNTER — Other Ambulatory Visit (HOSPITAL_COMMUNITY)
Admission: RE | Admit: 2019-02-02 | Discharge: 2019-02-02 | Disposition: A | Discharge status: Home / Self Care | Payer: Medicaid Other | Source: Ambulatory Visit | Attending: Orthopedic Surgery | Admitting: Orthopedic Surgery

## 2019-02-02 DIAGNOSIS — Z01812 Encounter for preprocedural laboratory examination: Secondary | ICD-10-CM | POA: Diagnosis present

## 2019-02-02 DIAGNOSIS — U071 COVID-19: Secondary | ICD-10-CM | POA: Insufficient documentation

## 2019-02-02 HISTORY — DX: COVID-19: U07.1

## 2019-02-02 LAB — SARS CORONAVIRUS 2 (TAT 6-24 HRS): SARS Coronavirus 2: POSITIVE — AB

## 2019-02-03 ENCOUNTER — Ambulatory Visit (HOSPITAL_BASED_OUTPATIENT_CLINIC_OR_DEPARTMENT_OTHER): Admission: RE | Admit: 2019-02-03 | Payer: Medicaid Other | Source: Home / Self Care | Admitting: Orthopedic Surgery

## 2019-02-03 ENCOUNTER — Encounter (HOSPITAL_COMMUNITY): Payer: Self-pay | Admitting: Certified Registered"

## 2019-02-03 SURGERY — NERVE, TENDON AND ARTERY REPAIR
Anesthesia: Choice | Laterality: Left

## 2019-02-03 NOTE — Progress Notes (Signed)
Spoke with Zachary [name] at Dr. Levell July office. Informed patient's covid test is positive.

## 2019-02-17 ENCOUNTER — Encounter (HOSPITAL_BASED_OUTPATIENT_CLINIC_OR_DEPARTMENT_OTHER): Payer: Self-pay | Admitting: Orthopedic Surgery

## 2019-02-17 ENCOUNTER — Other Ambulatory Visit: Payer: Self-pay

## 2019-02-19 ENCOUNTER — Other Ambulatory Visit: Payer: Self-pay | Admitting: Orthopedic Surgery

## 2019-03-12 ENCOUNTER — Other Ambulatory Visit: Payer: Self-pay

## 2019-03-12 ENCOUNTER — Encounter (HOSPITAL_BASED_OUTPATIENT_CLINIC_OR_DEPARTMENT_OTHER): Payer: Self-pay | Admitting: Orthopedic Surgery

## 2019-03-14 NOTE — Progress Notes (Signed)
Patient scheduled for Covid test on 2/1, patient was Covid + on 02/02/2019.  He does not need to be retested for 90 days from positive test.  Called patient cell number Aunt answered phone,  Left message for him, she is the next of kin contact.

## 2019-03-16 ENCOUNTER — Other Ambulatory Visit (HOSPITAL_COMMUNITY)
Admission: RE | Admit: 2019-03-16 | Discharge: 2019-03-16 | Disposition: A | Discharge status: Home / Self Care | Payer: Medicaid Other | Source: Ambulatory Visit | Attending: Orthopedic Surgery | Admitting: Orthopedic Surgery

## 2019-03-16 NOTE — Progress Notes (Signed)

## 2019-03-19 ENCOUNTER — Encounter (HOSPITAL_BASED_OUTPATIENT_CLINIC_OR_DEPARTMENT_OTHER)
Admission: RE | Disposition: A | Discharge status: Home / Self Care | Payer: Self-pay | Source: Home / Self Care | Attending: Orthopedic Surgery

## 2019-03-19 ENCOUNTER — Ambulatory Visit (HOSPITAL_BASED_OUTPATIENT_CLINIC_OR_DEPARTMENT_OTHER): Payer: Self-pay | Admitting: Certified Registered"

## 2019-03-19 ENCOUNTER — Encounter (HOSPITAL_BASED_OUTPATIENT_CLINIC_OR_DEPARTMENT_OTHER): Payer: Self-pay | Admitting: Orthopedic Surgery

## 2019-03-19 ENCOUNTER — Other Ambulatory Visit: Payer: Self-pay

## 2019-03-19 ENCOUNTER — Ambulatory Visit (HOSPITAL_BASED_OUTPATIENT_CLINIC_OR_DEPARTMENT_OTHER)
Admission: RE | Admit: 2019-03-19 | Discharge: 2019-03-19 | Disposition: A | Discharge status: Home / Self Care | Payer: Self-pay | Attending: Orthopedic Surgery | Admitting: Orthopedic Surgery

## 2019-03-19 DIAGNOSIS — S61412D Laceration without foreign body of left hand, subsequent encounter: Secondary | ICD-10-CM | POA: Insufficient documentation

## 2019-03-19 DIAGNOSIS — S64497D Injury of digital nerve of left little finger, subsequent encounter: Secondary | ICD-10-CM | POA: Insufficient documentation

## 2019-03-19 DIAGNOSIS — S66127D Laceration of flexor muscle, fascia and tendon of left little finger at wrist and hand level, subsequent encounter: Secondary | ICD-10-CM | POA: Insufficient documentation

## 2019-03-19 DIAGNOSIS — X58XXXD Exposure to other specified factors, subsequent encounter: Secondary | ICD-10-CM | POA: Insufficient documentation

## 2019-03-19 DIAGNOSIS — D3612 Benign neoplasm of peripheral nerves and autonomic nervous system, upper limb, including shoulder: Secondary | ICD-10-CM | POA: Insufficient documentation

## 2019-03-19 DIAGNOSIS — S64495D Injury of digital nerve of left ring finger, subsequent encounter: Secondary | ICD-10-CM | POA: Insufficient documentation

## 2019-03-19 HISTORY — PX: NERVE, TENDON AND ARTERY REPAIR: SHX5695

## 2019-03-19 SURGERY — NERVE, TENDON AND ARTERY REPAIR
Anesthesia: Monitor Anesthesia Care | Site: Hand | Laterality: Left

## 2019-03-19 MED ORDER — HEPARIN SODIUM (PORCINE) 1000 UNIT/ML IJ SOLN
INTRAMUSCULAR | Status: AC
  Filled 2019-03-19: qty 1

## 2019-03-19 MED ORDER — EPHEDRINE SULFATE-NACL 50-0.9 MG/10ML-% IV SOSY
PREFILLED_SYRINGE | INTRAVENOUS | Status: DC | PRN
  Administered 2019-03-19: 10 mg via INTRAVENOUS
  Administered 2019-03-19: 15 mg via INTRAVENOUS

## 2019-03-19 MED ORDER — PROPOFOL 10 MG/ML IV BOLUS
INTRAVENOUS | Status: AC
  Filled 2019-03-19: qty 20

## 2019-03-19 MED ORDER — FENTANYL CITRATE (PF) 100 MCG/2ML IJ SOLN
50.0000 ug | INTRAMUSCULAR | Status: DC | PRN
  Administered 2019-03-19: 100 ug via INTRAVENOUS

## 2019-03-19 MED ORDER — ROPIVACAINE HCL 5 MG/ML IJ SOLN
INTRAMUSCULAR | Status: DC | PRN
  Administered 2019-03-19: 30 mL via PERINEURAL

## 2019-03-19 MED ORDER — OXYCODONE HCL 5 MG/5ML PO SOLN: 5.0000 mg | Freq: Once | ORAL | Status: DC | PRN

## 2019-03-19 MED ORDER — OXYCODONE HCL 5 MG PO TABS: 5.0000 mg | ORAL_TABLET | Freq: Once | ORAL | Status: DC | PRN

## 2019-03-19 MED ORDER — PROPOFOL 500 MG/50ML IV EMUL
INTRAVENOUS | Status: DC | PRN
  Administered 2019-03-19: 250 ug/kg/min via INTRAVENOUS

## 2019-03-19 MED ORDER — DEXAMETHASONE SODIUM PHOSPHATE 10 MG/ML IJ SOLN
INTRAMUSCULAR | Status: AC
  Filled 2019-03-19: qty 1

## 2019-03-19 MED ORDER — CEFAZOLIN SODIUM-DEXTROSE 2-4 GM/100ML-% IV SOLN
INTRAVENOUS | Status: AC
  Filled 2019-03-19: qty 100

## 2019-03-19 MED ORDER — FENTANYL CITRATE (PF) 100 MCG/2ML IJ SOLN: 25.0000 ug | INTRAMUSCULAR | Status: DC | PRN

## 2019-03-19 MED ORDER — GLYCOPYRROLATE PF 0.2 MG/ML IJ SOSY
PREFILLED_SYRINGE | INTRAMUSCULAR | Status: DC | PRN
  Administered 2019-03-19: .2 mg via INTRAVENOUS

## 2019-03-19 MED ORDER — PHENYLEPHRINE 40 MCG/ML (10ML) SYRINGE FOR IV PUSH (FOR BLOOD PRESSURE SUPPORT)
PREFILLED_SYRINGE | INTRAVENOUS | Status: AC
  Filled 2019-03-19: qty 10

## 2019-03-19 MED ORDER — ONDANSETRON HCL 4 MG/2ML IJ SOLN
INTRAMUSCULAR | Status: AC
  Filled 2019-03-19: qty 2

## 2019-03-19 MED ORDER — LACTATED RINGERS IV SOLN: INTRAVENOUS | Status: DC

## 2019-03-19 MED ORDER — CLONIDINE HCL (ANALGESIA) 100 MCG/ML EP SOLN
EPIDURAL | Status: DC | PRN
  Administered 2019-03-19: 100 ug

## 2019-03-19 MED ORDER — CEFAZOLIN SODIUM-DEXTROSE 2-4 GM/100ML-% IV SOLN
2.0000 g | INTRAVENOUS | Status: AC
  Administered 2019-03-19: 09:00:00 2 g via INTRAVENOUS

## 2019-03-19 MED ORDER — LIDOCAINE 2% (20 MG/ML) 5 ML SYRINGE
INTRAMUSCULAR | Status: AC
  Filled 2019-03-19: qty 5

## 2019-03-19 MED ORDER — CHLORHEXIDINE GLUCONATE 4 % EX LIQD: 60.0000 mL | Freq: Once | CUTANEOUS | Status: DC

## 2019-03-19 MED ORDER — MIDAZOLAM HCL 2 MG/2ML IJ SOLN
1.0000 mg | INTRAMUSCULAR | Status: DC | PRN
  Administered 2019-03-19: 2 mg via INTRAVENOUS

## 2019-03-19 MED ORDER — MIDAZOLAM HCL 5 MG/5ML IJ SOLN
INTRAMUSCULAR | Status: DC | PRN
  Administered 2019-03-19: 2 mg via INTRAVENOUS

## 2019-03-19 MED ORDER — HEPARIN SODIUM (PORCINE) 5000 UNIT/ML IJ SOLN
INTRAMUSCULAR | Status: AC
  Filled 2019-03-19: qty 1

## 2019-03-19 MED ORDER — PROPOFOL 500 MG/50ML IV EMUL
INTRAVENOUS | Status: AC
  Filled 2019-03-19: qty 50

## 2019-03-19 MED ORDER — FENTANYL CITRATE (PF) 100 MCG/2ML IJ SOLN
INTRAMUSCULAR | Status: AC
  Filled 2019-03-19: qty 2

## 2019-03-19 MED ORDER — BUPIVACAINE HCL (PF) 0.25 % IJ SOLN
INTRAMUSCULAR | Status: AC
  Filled 2019-03-19: qty 30

## 2019-03-19 MED ORDER — MIDAZOLAM HCL 2 MG/2ML IJ SOLN
INTRAMUSCULAR | Status: AC
  Filled 2019-03-19: qty 2

## 2019-03-19 MED ORDER — DEXAMETHASONE SODIUM PHOSPHATE 10 MG/ML IJ SOLN
INTRAMUSCULAR | Status: DC | PRN
  Administered 2019-03-19: 4 mg via INTRAVENOUS

## 2019-03-19 MED ORDER — PHENYLEPHRINE 40 MCG/ML (10ML) SYRINGE FOR IV PUSH (FOR BLOOD PRESSURE SUPPORT)
PREFILLED_SYRINGE | INTRAVENOUS | Status: DC | PRN
  Administered 2019-03-19 (×2): 80 ug via INTRAVENOUS

## 2019-03-19 MED ORDER — OXYCODONE-ACETAMINOPHEN 5-325 MG PO TABS: ORAL_TABLET | ORAL | 0 refills | Status: DC

## 2019-03-19 MED ORDER — PROPOFOL 10 MG/ML IV BOLUS
INTRAVENOUS | Status: AC
  Filled 2019-03-19: qty 40

## 2019-03-19 MED ORDER — ONDANSETRON HCL 4 MG/2ML IJ SOLN: 4.0000 mg | Freq: Once | INTRAMUSCULAR | Status: DC | PRN

## 2019-03-19 MED ORDER — ONDANSETRON HCL 4 MG/2ML IJ SOLN
INTRAMUSCULAR | Status: DC | PRN
  Administered 2019-03-19: 4 mg via INTRAVENOUS

## 2019-03-19 MED ORDER — LIDOCAINE HCL (PF) 1 % IJ SOLN
INTRAMUSCULAR | Status: AC
  Filled 2019-03-19: qty 30

## 2019-03-19 MED ORDER — LIDOCAINE 2% (20 MG/ML) 5 ML SYRINGE
INTRAMUSCULAR | Status: DC | PRN
  Administered 2019-03-19: 20 mg via INTRAVENOUS

## 2019-03-19 SURGICAL SUPPLY — 67 items
BAG DECANTER FOR FLEXI CONT (MISCELLANEOUS) IMPLANT
BLADE MINI RND TIP GREEN BEAV (BLADE) IMPLANT
BLADE SURG 15 STRL LF DISP TIS (BLADE) ×2 IMPLANT
BLADE SURG 15 STRL SS (BLADE) ×6
BNDG ELASTIC 3X5.8 VLCR STR LF (GAUZE/BANDAGES/DRESSINGS) ×3 IMPLANT
BNDG ESMARK 4X9 LF (GAUZE/BANDAGES/DRESSINGS) ×3 IMPLANT
BNDG GAUZE ELAST 4 BULKY (GAUZE/BANDAGES/DRESSINGS) ×3 IMPLANT
BRUSH SCRUB EZ PLAIN DRY (MISCELLANEOUS) ×3 IMPLANT
CATH ROBINSON RED A/P 10FR (CATHETERS) IMPLANT
CHLORAPREP W/TINT 26 (MISCELLANEOUS) ×6 IMPLANT
CONNECTOR NERVE AXOGUARD 3X15 (Nerve Graft) ×3 IMPLANT
CORD BIPOLAR FORCEPS 12FT (ELECTRODE) ×3 IMPLANT
COVER BACK TABLE 60X90IN (DRAPES) ×3 IMPLANT
COVER MAYO STAND STRL (DRAPES) ×3 IMPLANT
COVER WAND RF STERILE (DRAPES) IMPLANT
CUFF TOURN SGL QUICK 18X4 (TOURNIQUET CUFF) ×3 IMPLANT
DECANTER SPIKE VIAL GLASS SM (MISCELLANEOUS) ×3 IMPLANT
DRAPE EXTREMITY T 121X128X90 (DISPOSABLE) ×3 IMPLANT
DRAPE SURG 17X23 STRL (DRAPES) ×3 IMPLANT
GAUZE SPONGE 4X4 12PLY STRL (GAUZE/BANDAGES/DRESSINGS) ×3 IMPLANT
GAUZE XEROFORM 1X8 LF (GAUZE/BANDAGES/DRESSINGS) ×3 IMPLANT
GLOVE BIO SURGEON STRL SZ 6.5 (GLOVE) ×2 IMPLANT
GLOVE BIO SURGEON STRL SZ7.5 (GLOVE) ×3 IMPLANT
GLOVE BIO SURGEONS STRL SZ 6.5 (GLOVE) ×1
GLOVE BIOGEL PI IND STRL 7.0 (GLOVE) ×2 IMPLANT
GLOVE BIOGEL PI IND STRL 8 (GLOVE) ×1 IMPLANT
GLOVE BIOGEL PI IND STRL 8.5 (GLOVE) ×1 IMPLANT
GLOVE BIOGEL PI IND STRL 9 (GLOVE) ×2 IMPLANT
GLOVE BIOGEL PI INDICATOR 7.0 (GLOVE) ×4
GLOVE BIOGEL PI INDICATOR 8 (GLOVE) ×2
GLOVE BIOGEL PI INDICATOR 8.5 (GLOVE) ×2
GLOVE BIOGEL PI INDICATOR 9 (GLOVE) ×4
GLOVE SURG ORTHO 8.0 STRL STRW (GLOVE) ×3 IMPLANT
GOWN STRL REUS W/ TWL LRG LVL3 (GOWN DISPOSABLE) ×1 IMPLANT
GOWN STRL REUS W/TWL LRG LVL3 (GOWN DISPOSABLE) ×2
GOWN STRL REUS W/TWL XL LVL3 (GOWN DISPOSABLE) ×6 IMPLANT
LOOP VESSEL MAXI BLUE (MISCELLANEOUS) IMPLANT
NDL SAFETY ECLIPSE 18X1.5 (NEEDLE) IMPLANT
NEEDLE HYPO 18GX1.5 SHARP (NEEDLE)
NEEDLE HYPO 25X1 1.5 SAFETY (NEEDLE) IMPLANT
NS IRRIG 1000ML POUR BTL (IV SOLUTION) ×3 IMPLANT
PACK BASIN DAY SURGERY FS (CUSTOM PROCEDURE TRAY) ×3 IMPLANT
PAD CAST 3X4 CTTN HI CHSV (CAST SUPPLIES) ×1 IMPLANT
PAD CAST 4YDX4 CTTN HI CHSV (CAST SUPPLIES) ×1 IMPLANT
PADDING CAST ABS 4INX4YD NS (CAST SUPPLIES) ×2
PADDING CAST ABS COTTON 4X4 ST (CAST SUPPLIES) ×1 IMPLANT
PADDING CAST COTTON 3X4 STRL (CAST SUPPLIES) ×2
PADDING CAST COTTON 4X4 STRL (CAST SUPPLIES) ×2
SLEEVE SCD COMPRESS KNEE MED (MISCELLANEOUS) ×3 IMPLANT
SPEAR EYE SURG WECK-CEL (MISCELLANEOUS) ×3 IMPLANT
SPLINT PLASTER CAST XFAST 3X15 (CAST SUPPLIES) ×20 IMPLANT
SPLINT PLASTER XTRA FASTSET 3X (CAST SUPPLIES) ×40
STOCKINETTE 4X48 STRL (DRAPES) ×3 IMPLANT
SUT ETHIBOND 3-0 V-5 (SUTURE) ×3 IMPLANT
SUT ETHILON 4 0 PS 2 18 (SUTURE) ×3 IMPLANT
SUT FIBERWIRE 4-0 18 TAPR NDL (SUTURE)
SUT NYLON 9 0 VRM6 (SUTURE) ×3 IMPLANT
SUT PROLENE 6 0 P 1 18 (SUTURE) IMPLANT
SUT SILK 4 0 PS 2 (SUTURE) ×3 IMPLANT
SUT SUPRAMID 4-0 (SUTURE) ×3 IMPLANT
SUT VICRYL 4-0 PS2 18IN ABS (SUTURE) IMPLANT
SUTURE FIBERWR 4-0 18 TAPR NDL (SUTURE) IMPLANT
SYR BULB 3OZ (MISCELLANEOUS) ×3 IMPLANT
SYR CONTROL 10ML LL (SYRINGE) IMPLANT
TOWEL GREEN STERILE FF (TOWEL DISPOSABLE) ×6 IMPLANT
TRAY DSU PREP LF (CUSTOM PROCEDURE TRAY) IMPLANT
UNDERPAD 30X36 HEAVY ABSORB (UNDERPADS AND DIAPERS) ×3 IMPLANT

## 2019-03-19 NOTE — Progress Notes (Signed)
Assisted Dr. Witman with left, ultrasound guided, supraclavicular block. Side rails up, monitors on throughout procedure. See vital signs in flow sheet. Tolerated Procedure well. °

## 2019-03-19 NOTE — Op Note (Signed)
I assisted Surgeon(s) and Role:    * Leanora Cover, MD - Primary    Daryll Brod, MD - Assisting on the Procedure(s): LEFT PALM NERVE, [place] on 03/19/2019.  I provided assistance on this case as follows: Set up, approach, identification of arteries nerves tendons, harvesting of the tendon graft, bringing in the operative microscope for repair of the digital artery, left identification preparation of the nerve with applying the neural tube, closure of the wounds, application dressing and splint.  Electronically signed by: Daryll Brod, MD Date: 03/19/2019 Time: 11:35 AM

## 2019-03-19 NOTE — Discharge Instructions (Addendum)

## 2019-03-19 NOTE — Anesthesia Postprocedure Evaluation (Signed)
Anesthesia Post Note  Patient: Zachary [name]  Procedure(s) Performed: LEFT PALM NERVE, TENDON AND ARTERY REPAIR (Left Hand)     Patient location during evaluation: PACU Anesthesia Type: Regional Level of consciousness: awake and alert Pain management: pain level controlled Vital Signs Assessment: post-procedure vital signs reviewed and stable Respiratory status: spontaneous breathing, nonlabored ventilation and respiratory function stable Cardiovascular status: blood pressure returned to baseline and stable Postop Assessment: no apparent nausea or vomiting Anesthetic complications: no    Last Vitals:  Vitals:   03/19/19 1145 03/19/19 1200  BP: (!) 107/54 124/78  Pulse: (!) 53   Resp: 12 15  Temp:    SpO2: 100%     Last Pain:  Vitals:   03/19/19 1155  PainSc: 0-No pain                 Lidia Collum

## 2019-03-19 NOTE — Anesthesia Preprocedure Evaluation (Addendum)
Anesthesia Evaluation  Patient identified by MRN, date of birth, ID band Patient awake    Reviewed: Allergy & Precautions, NPO status , Patient's Chart, lab work & pertinent test results  History of Anesthesia Complications Negative for: history of anesthetic complications  Airway Mallampati: II  TM Distance: >3 FB Neck ROM: Full    Dental  (+) Teeth Intact   Pulmonary Current Smoker and Patient abstained from smoking.,    Pulmonary exam normal        Cardiovascular negative cardio ROS Normal cardiovascular exam     Neuro/Psych PSYCHIATRIC DISORDERS Anxiety Depression Bipolar Disorder negative neurological ROS     GI/Hepatic negative GI ROS, Neg liver ROS,   Endo/Other  negative endocrine ROS  Renal/GU negative Renal ROS  negative genitourinary   Musculoskeletal negative musculoskeletal ROS (+)   Abdominal   Peds  (+) ATTENTION DEFICIT DISORDER WITHOUT HYPERACTIVITY Hematology negative hematology ROS (+)   Anesthesia Other Findings COVID infection in December 2020 (positive 02/02/19)  Reproductive/Obstetrics                          Anesthesia Physical Anesthesia Plan  ASA: II  Anesthesia Plan: MAC and Regional   Post-op Pain Management:  Regional for Post-op pain   Induction: Intravenous  PONV Risk Score and Plan: 1 and Propofol infusion, TIVA and Treatment may vary due to age or medical condition  Airway Management Planned: Natural Airway, Nasal Cannula and Simple Face Mask  Additional Equipment: None  Intra-op Plan:   Post-operative Plan:   Informed Consent: I have reviewed the patients History and Physical, chart, labs and discussed the procedure including the risks, benefits and alternatives for the proposed anesthesia with the patient or authorized representative who has indicated his/her understanding and acceptance.       Plan Discussed with:   Anesthesia Plan  Comments:        Anesthesia Quick Evaluation

## 2019-03-19 NOTE — Anesthesia Procedure Notes (Signed)
Anesthesia Regional Block: Supraclavicular block   Pre-Anesthetic Checklist: ,, timeout performed, Correct Patient, Correct Site, Correct Laterality, Correct Procedure, Correct Position, site marked, Risks and benefits discussed,  Surgical consent,  Pre-op evaluation,  At surgeon's request and post-op pain management  Laterality: Left  Prep: chloraprep       Needles:  Injection technique: Single-shot  Needle Type: Echogenic Stimulator Needle     Needle Length: 9cm  Needle Gauge: 21     Additional Needles:   Procedures:,,,, ultrasound used (permanent image in chart),,,,  Narrative:  Start time: 03/19/2019 8:00 AM End time: 03/19/2019 8:06 AM Injection made incrementally with aspirations every 5 mL.  Performed by: Personally  Anesthesiologist: Lidia Collum, MD  Additional Notes: Monitors applied. Injection made in 5cc increments. No resistance to injection. Good needle visualization. Patient tolerated procedure well.
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
## 2019-03-19 NOTE — H&P (Signed)
Zachary [name] is an 24 y.o. male.   Chief Complaint: left palm laceration HPI: 24 yo male sustained laceration to left palm in October 2020.  Unable to flex left small finger.  Decreased sensation in small finger and ulnar side of ring finger.  He wishes to proceed with operative exploration of hand with repair vs reconstruction of tendon/artery/nerve as necessary.  Allergies: No Known Allergies  Past Medical History:  Diagnosis Date  . Anxiety   . 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 Bipolar disorder ([place])   . Clinical diagnosis of COVID-19 02/02/2019    Past Surgical History:  Procedure Laterality Date  . HIP SURGERY      Family History: History reviewed. No pertinent family history.  Social History:   reports that he has been smoking cigarettes. He has been smoking about 1.00 pack per day. He has never used smokeless tobacco. He reports current alcohol use. He reports current drug use. Drug: Marijuana.  Medications: Medications Prior to Admission  Medication Sig Dispense Refill  . acetaminophen (TYLENOL) 325 MG tablet Take 2 tablets (650 mg total) by mouth every 6 (six) hours as needed for up to 30 doses. 30 tablet 0    No results found for this or any previous visit (from the past 48 hour(s)).  No results found.   A comprehensive review of systems was negative.  Blood pressure 116/75, pulse (!) 57, temperature (!) 97.5 F (36.4 C), resp. rate 13, height 5\' 11"  (1.803 m), weight 103.8 kg, SpO2 100 %.  General appearance: alert, cooperative and appears stated age Head: Normocephalic, without obvious abnormality, atraumatic Neck: supple, symmetrical, trachea midline Cardio: regular rate and rhythm Resp: clear to auscultation bilaterally Extremities: Intact sensation and capillary refill all digits except decreased sensation in ring and small fingers.  +epl/fpl/io.  unable to flex small finger.  Healed wound in palm.  Pulses: 2+ and  symmetric Skin: Skin color, texture, turgor normal. No rashes or lesions Neurologic: Grossly normal Incision/Wound: as above  Assessment/Plan Left palm laceration with tendon and nerve laceration. Plan exploration with repair vs reconstruction of tendon/artery/nerve as necessary.  Discussed possible repair of tendon to adjacent ring finger tendons and possible need for nerve tube or tendon graft.  Non operative and operative treatment options have been discussed with the patient and patient wishes to proceed with operative treatment. Risks, benefits, and alternatives of surgery have been discussed and the patient agrees with the plan of care.   Leanora Cover 03/19/2019, 8:48 AM

## 2019-03-19 NOTE — Transfer of Care (Signed)
Immediate Anesthesia Transfer of Care Note  Patient: Zachary [name]  Procedure(s) Performed: LEFT PALM NERVE, TENDON AND ARTERY REPAIR (Left Hand)  Patient Location: PACU  Anesthesia Type:MAC and Regional  Level of Consciousness: sedated  Airway & Oxygen Therapy: Patient Spontanous Breathing and Patient connected to face mask oxygen  Post-op Assessment: Report given to RN and Post -op Vital signs reviewed and stable  Post vital signs: Reviewed and stable  Last Vitals:  Vitals Value Taken Time  BP    Temp    Pulse 70 03/19/19 1143  Resp 17 03/19/19 1143  SpO2 100 % 03/19/19 1143  Vitals shown include unvalidated device data.  Last Pain:  Vitals:   03/19/19 0731  PainSc: 7       Patients Stated Pain Goal: 5 (69/48/54 6270)  Complications: No apparent anesthesia complications

## 2019-03-20 LAB — SURGICAL PATHOLOGY

## 2019-03-24 ENCOUNTER — Encounter: Payer: Self-pay | Admitting: *Deleted

## 2019-03-25 DIAGNOSIS — S65812D Laceration of other blood vessels at wrist and hand level of left arm, subsequent encounter: Secondary | ICD-10-CM | POA: Insufficient documentation

## 2019-05-11 ENCOUNTER — Emergency Department (HOSPITAL_COMMUNITY)
Admission: EM | Admit: 2019-05-11 | Discharge: 2019-05-11 | Disposition: A | Discharge status: Home / Self Care | Payer: Self-pay | Attending: Emergency Medicine | Admitting: Emergency Medicine

## 2019-05-11 ENCOUNTER — Emergency Department (HOSPITAL_COMMUNITY): Payer: Self-pay

## 2019-05-11 DIAGNOSIS — F319 Bipolar disorder, unspecified: Secondary | ICD-10-CM | POA: Insufficient documentation

## 2019-05-11 DIAGNOSIS — Y939 Activity, unspecified: Secondary | ICD-10-CM | POA: Insufficient documentation

## 2019-05-11 DIAGNOSIS — M25562 Pain in left knee: Secondary | ICD-10-CM | POA: Insufficient documentation

## 2019-05-11 DIAGNOSIS — Y929 Unspecified place or not applicable: Secondary | ICD-10-CM | POA: Insufficient documentation

## 2019-05-11 DIAGNOSIS — F909 Attention-deficit hyperactivity disorder, unspecified type: Secondary | ICD-10-CM | POA: Insufficient documentation

## 2019-05-11 DIAGNOSIS — R519 Headache, unspecified: Secondary | ICD-10-CM | POA: Insufficient documentation

## 2019-05-11 DIAGNOSIS — S62395A Other fracture of fourth metacarpal bone, left hand, initial encounter for closed fracture: Secondary | ICD-10-CM | POA: Insufficient documentation

## 2019-05-11 DIAGNOSIS — Z8616 Personal history of COVID-19: Secondary | ICD-10-CM | POA: Insufficient documentation

## 2019-05-11 DIAGNOSIS — Z720 Tobacco use: Secondary | ICD-10-CM | POA: Insufficient documentation

## 2019-05-11 DIAGNOSIS — S60512A Abrasion of left hand, initial encounter: Secondary | ICD-10-CM | POA: Insufficient documentation

## 2019-05-11 DIAGNOSIS — M25561 Pain in right knee: Secondary | ICD-10-CM | POA: Insufficient documentation

## 2019-05-11 DIAGNOSIS — R52 Pain, unspecified: Secondary | ICD-10-CM

## 2019-05-11 DIAGNOSIS — Z23 Encounter for immunization: Secondary | ICD-10-CM | POA: Insufficient documentation

## 2019-05-11 DIAGNOSIS — Y999 Unspecified external cause status: Secondary | ICD-10-CM | POA: Insufficient documentation

## 2019-05-11 LAB — COMPREHENSIVE METABOLIC PANEL
ALT: 22 U/L (ref 0–44)
AST: 28 U/L (ref 15–41)
Albumin: 4.1 g/dL (ref 3.5–5.0)
Alkaline Phosphatase: 52 U/L (ref 38–126)
Anion gap: 9 (ref 5–15)
BUN: 16 mg/dL (ref 6–20)
CO2: 20 mmol/L — ABNORMAL LOW (ref 22–32)
Calcium: 9.7 mg/dL (ref 8.9–10.3)
Chloride: 110 mmol/L (ref 98–111)
Creatinine, Ser: 0.95 mg/dL (ref 0.61–1.24)
GFR calc Af Amer: >60 mL/min (ref >60)
GFR calc non Af Amer: >60 mL/min (ref >60)
Glucose, Bld: 112 mg/dL — ABNORMAL HIGH (ref 70–99)
Potassium: 3.9 mmol/L (ref 3.5–5.1)
Sodium: 139 mmol/L (ref 135–145)
Total Bilirubin: 0.9 mg/dL (ref 0.3–1.2)
Total Protein: 6.9 g/dL (ref 6.5–8.1)

## 2019-05-11 LAB — CBC
HCT: 49.2 % (ref 39.0–52.0)
Hemoglobin: 15.4 g/dL (ref 13.0–17.0)
MCH: 25.8 pg — ABNORMAL LOW (ref 26.0–34.0)
MCHC: 31.3 g/dL (ref 30.0–36.0)
MCV: 82.6 fL (ref 80.0–100.0)
Platelets: 226 10*3/uL (ref 150–400)
RBC: 5.96 MIL/uL — ABNORMAL HIGH (ref 4.22–5.81)
RDW: 13.2 % (ref 11.5–15.5)
WBC: 9.6 10*3/uL (ref 4.0–10.5)
nRBC: 0 % (ref 0.0–0.2)

## 2019-05-11 LAB — I-STAT CHEM 8, ED
BUN: 17 mg/dL (ref 6–20)
Calcium, Ion: 1.19 mmol/L (ref 1.15–1.40)
Chloride: 110 mmol/L (ref 98–111)
Creatinine, Ser: 0.9 mg/dL (ref 0.61–1.24)
Glucose, Bld: 105 mg/dL — ABNORMAL HIGH (ref 70–99)
HCT: 47 % (ref 39.0–52.0)
Hemoglobin: 16 g/dL (ref 13.0–17.0)
Potassium: 3.7 mmol/L (ref 3.5–5.1)
Sodium: 141 mmol/L (ref 135–145)
TCO2: 20 mmol/L — ABNORMAL LOW (ref 22–32)

## 2019-05-11 LAB — ETHANOL: Alcohol, Ethyl (B): <10 mg/dL (ref <10)

## 2019-05-11 MED ORDER — LIDOCAINE-EPINEPHRINE (PF) 2 %-1:200000 IJ SOLN
20.0000 mL | Freq: Once | INTRAMUSCULAR | Status: AC
  Administered 2019-05-11: 20 mL
  Filled 2019-05-11: qty 20

## 2019-05-11 MED ORDER — TETANUS-DIPHTH-ACELL PERTUSSIS 5-2.5-18.5 LF-MCG/0.5 IM SUSP
0.5000 mL | Freq: Once | INTRAMUSCULAR | Status: AC
  Administered 2019-05-11: 0.5 mL via INTRAMUSCULAR
  Filled 2019-05-11: qty 0.5

## 2019-05-11 MED ORDER — ONDANSETRON HCL 4 MG/2ML IJ SOLN: 4.0000 mg | Freq: Once | INTRAMUSCULAR | Status: DC

## 2019-05-11 MED ORDER — MORPHINE SULFATE (PF) 4 MG/ML IV SOLN: 4.0000 mg | Freq: Once | INTRAVENOUS | Status: DC

## 2019-05-11 MED ORDER — FENTANYL CITRATE (PF) 100 MCG/2ML IJ SOLN
100.0000 ug | Freq: Once | INTRAMUSCULAR | Status: AC
  Administered 2019-05-11: 100 ug via INTRAMUSCULAR
  Filled 2019-05-11: qty 2

## 2019-05-11 MED ORDER — MORPHINE SULFATE 15 MG PO TABS: 7.5000 mg | ORAL_TABLET | ORAL | 0 refills | Status: DC | PRN
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
## 2019-05-11 NOTE — Progress Notes (Signed)
Orthopedic Tech Progress Note Patient Details:  Zachary [name] April 02, 1995 [id number]  Ortho Devices Type of Ortho Device: Arm sling, Ulna gutter splint Ortho Device/Splint Location: LUE Ortho Device/Splint Interventions: Ordered, Application   Post Interventions Patient Tolerated: Well Instructions Provided: Care of device, Adjustment of device   [name] 05/11/2019, 1:59 PM

## 2019-05-11 NOTE — Discharge Instructions (Addendum)
Take 4 over the counter ibuprofen tablets 3 times a day or 2 over-the-counter naproxen tablets twice a day for pain. Also take tylenol 1000mg (2 extra strength) four times a day.   Then take the pain medicine if you feel like you need it. Narcotics do not help with the pain, they only make you care about it less.  You can become addicted to this, people may break into your house to steal it.  It will constipate you.  If you drive under the influence of this medicine you can get a DUI.    Keep the wounds clean.  Please return for redness drainage or fever.  The stitches to dissolve and sore between 3 to 5 days.  If they are still there after that you can gently plucked them out with tweezers.  The area is okay to get wet but do not fully get it immersed underwater.  I.e. no swelling or scuba diving.
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
## 2019-05-11 NOTE — ED Notes (Signed)
Pt in xray

## 2019-05-11 NOTE — ED Notes (Signed)
Ortho tech paged  

## 2019-09-03 ENCOUNTER — Other Ambulatory Visit: Payer: Self-pay

## 2019-09-04 ENCOUNTER — Other Ambulatory Visit: Payer: Self-pay

## 2019-09-04 ENCOUNTER — Ambulatory Visit (HOSPITAL_COMMUNITY)
Admission: EM | Admit: 2019-09-04 | Discharge: 2019-09-04 | Disposition: A | Discharge status: Home / Self Care | Payer: No Payment, Other | Attending: Psychiatry | Admitting: Psychiatry

## 2019-09-04 DIAGNOSIS — F1210 Cannabis abuse, uncomplicated: Secondary | ICD-10-CM | POA: Diagnosis not present

## 2019-09-04 DIAGNOSIS — Z652 Problems related to release from prison: Secondary | ICD-10-CM | POA: Insufficient documentation

## 2019-09-04 DIAGNOSIS — Z915 Personal history of self-harm: Secondary | ICD-10-CM | POA: Diagnosis not present

## 2019-09-04 DIAGNOSIS — F319 Bipolar disorder, unspecified: Secondary | ICD-10-CM | POA: Diagnosis not present

## 2019-09-04 DIAGNOSIS — F3131 Bipolar disorder, current episode depressed, mild: Secondary | ICD-10-CM
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
## 2019-09-04 NOTE — Discharge Instructions (Addendum)
Go to second floor of [place] at 12:30 today for open access. Schedule follow up appointment  Take all of your medications as prescribed by your mental healthcare provider.  Report any adverse effects and reactions from your medications to your outpatient provider promptly.  Do not engage in alcohol and or illegal drug use while on prescription medicines. Keep all scheduled appointments. This is to ensure that you are getting refills on time and to avoid any interruption in your medication.  If you are unable to keep an appointment call to reschedule.  Be sure to follow up with resources and follow ups given. In the event of worsening symptoms call the crisis hotline, 911, and or go to the nearest emergency department for appropriate evaluation and treatment of symptoms. Follow-up with your primary care provider for your medical issues, concerns and or health care needs.
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
## 2019-09-04 NOTE — Progress Notes (Signed)
Zachary [name] received his AVS, his questions were answered and he retrieved his personal belongings. He was escorted to the lobby.

## 2019-09-22 ENCOUNTER — Emergency Department (HOSPITAL_COMMUNITY)
Admission: EM | Admit: 2019-09-22 | Discharge: 2019-09-23 | Disposition: A | Discharge status: Home / Self Care | Payer: Self-pay | Attending: Emergency Medicine | Admitting: Emergency Medicine

## 2019-09-22 DIAGNOSIS — Y939 Activity, unspecified: Secondary | ICD-10-CM | POA: Insufficient documentation

## 2019-09-22 DIAGNOSIS — Z5321 Procedure and treatment not carried out due to patient leaving prior to being seen by health care provider: Secondary | ICD-10-CM | POA: Insufficient documentation

## 2019-09-22 DIAGNOSIS — W231XXA Caught, crushed, jammed, or pinched between stationary objects, initial encounter: Secondary | ICD-10-CM | POA: Insufficient documentation

## 2019-09-22 DIAGNOSIS — M79671 Pain in right foot: Secondary | ICD-10-CM | POA: Insufficient documentation

## 2019-09-22 DIAGNOSIS — Y929 Unspecified place or not applicable: Secondary | ICD-10-CM | POA: Insufficient documentation

## 2019-09-22 DIAGNOSIS — M79644 Pain in right finger(s): Secondary | ICD-10-CM | POA: Insufficient documentation

## 2019-09-22 DIAGNOSIS — Y999 Unspecified external cause status: Secondary | ICD-10-CM | POA: Insufficient documentation

## 2019-09-23 ENCOUNTER — Emergency Department (HOSPITAL_COMMUNITY): Payer: Self-pay

## 2019-09-23 ENCOUNTER — Ambulatory Visit (HOSPITAL_COMMUNITY)
Admission: EM | Admit: 2019-09-23 | Discharge: 2019-09-23 | Disposition: A | Discharge status: Home / Self Care | Payer: Medicaid Other | Attending: Emergency Medicine | Admitting: Emergency Medicine

## 2019-09-23 ENCOUNTER — Other Ambulatory Visit: Payer: Self-pay

## 2019-09-23 ENCOUNTER — Encounter (HOSPITAL_COMMUNITY): Payer: Self-pay | Admitting: Emergency Medicine

## 2019-09-23 ENCOUNTER — Encounter (HOSPITAL_COMMUNITY): Payer: Self-pay

## 2019-09-23 DIAGNOSIS — M79644 Pain in right finger(s): Secondary | ICD-10-CM

## 2019-09-23 DIAGNOSIS — S90121A Contusion of right lesser toe(s) without damage to nail, initial encounter: Secondary | ICD-10-CM

## 2019-09-23 MED ORDER — IBUPROFEN 800 MG PO TABS: 800.0000 mg | ORAL_TABLET | Freq: Three times a day (TID) | ORAL | 0 refills | Status: DC

## 2019-09-23 NOTE — ED Notes (Signed)
Have called pt for vitals and no answer x 2

## 2019-09-23 NOTE — ED Triage Notes (Signed)
Pt presents with right small toe pain after jamming it against furniture yesterday; pt also has right little finger pain & swelling.

## 2019-09-23 NOTE — Discharge Instructions (Signed)
Use anti-inflammatories for pain/swelling. You may take up to 800 mg Ibuprofen every 8 hours with food. You may supplement Ibuprofen with Tylenol [phone] mg every 8 hours.  Ice and elevate foot/toe Follow up if not improving

## 2019-09-23 NOTE — ED Triage Notes (Signed)
Patient here with right foot pain/pinky toe pain and pinky on right hand.  Patient states that he jammed it on  Sunday.

## 2019-09-23 NOTE — ED Notes (Signed)
Called pt in lobby and outside with no answer

## 2019-09-23 NOTE — ED Provider Notes (Signed)
[facility]    CSN: [id number] Arrival date & time: 09/23/19  0809      History   Chief Complaint Chief Complaint  Patient presents with   Toe Injury   Finger Pain    HPI Zachary [name] is a 24 y.o. male presenting today for evaluation of right toe pain and right little finger pain.  Patient reports that yesterday he jammed his toe against the railing of the bed.  Since has had pain swelling and a lot of sensitivity to the area.  He is concerned about fluid being in the toe.  He also reports pain and swelling to his right little finger at his PIP since Friday.  He believes he injured it while playing some sports.  He went to the emergency room yesterday and had imaging of hand and foot which were negative.  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 Authorizing Provider  ibuprofen (ADVIL) 800 MG tablet Take 1 tablet (800 mg total) by mouth 3 (three) times daily. 09/23/19   Miyeko Mahlum C, PA-C  morphine (MSIR) 15 MG tablet Take 0.5 tablets (7.5 mg total) by mouth every 4 (four) hours as needed for severe pain. 05/11/19   Deno Etienne, DO  oxyCODONE-acetaminophen (PERCOCET) 5-325 MG tablet 1-2 tabs PO q6 hours prn pain 03/19/19   Leanora Cover, MD    Family History History reviewed. No pertinent family  history.  Social History Social History   Tobacco Use   Smoking status: Current Some Day Smoker    Packs/day: 1.00    Types: Cigarettes   Smokeless tobacco: Never Used  Substance Use Topics   Alcohol use: Yes    Comment: rare   Drug use: Yes    Types: Marijuana    Comment: last used 03/18/19 2000     Allergies   Patient has no known allergies.   Review of Systems Review of Systems  Constitutional: Negative for fatigue and fever.  Eyes: Negative for redness, itching and visual disturbance.  Respiratory: Negative for shortness of breath.   Cardiovascular: Negative for chest pain and leg swelling.  Gastrointestinal: Negative for nausea and vomiting.  Musculoskeletal: Positive for arthralgias, gait problem and joint swelling. Negative for myalgias.  Skin: Negative for color change, rash and wound.  Neurological: Negative for dizziness, syncope, weakness, light-headedness and headaches.     Physical Exam Triage Vital Signs ED Triage Vitals  Enc Vitals Group     BP      Pulse      Resp      Temp      Temp src      SpO2      Weight      Height      Head Circumference  Peak Flow      Pain Score      Pain Loc      Pain Edu?      Excl. in [place]?    No data found.  Updated Vital Signs BP 138/75 (BP Location: Right Arm)    Pulse (!) 59    Temp 97.9 F (36.6 C) (Oral)    Resp 18    SpO2 97%   Visual Acuity Right Eye Distance:   Left Eye Distance:   Bilateral Distance:    Right Eye Near:   Left Eye Near:    Bilateral Near:     Physical Exam Vitals and nursing note reviewed.  Constitutional:      Appearance: He is well-developed.     Comments: No acute distress  HENT:     Head: Normocephalic and atraumatic.     Nose: Nose normal.  Eyes:     Conjunctiva/sclera: Conjunctivae normal.  Cardiovascular:     Rate and Rhythm: Normal rate.  Pulmonary:     Effort: Pulmonary effort is normal. No respiratory distress.  Abdominal:     General: There is no  distension.  Musculoskeletal:        General: Normal range of motion.     Cervical back: Neck supple.     Comments: Right foot-mild swelling noted about little toe, no other swelling or discoloration, tenderness to palpation along lateral aspect of little toe, no erythema warmth or notable pus Dorsalis pedis 2+  Right hand: Mild swelling about PIP of little finger, limited range of motion due to pain, full active range of motion at DIP, nontender throughout dorsum of hand, no overlying erythema  Skin:    General: Skin is warm and dry.  Neurological:     Mental Status: He is alert and oriented to person, place, and time.      UC Treatments / Results  Labs (all labs ordered are listed, but only abnormal results are displayed) Labs Reviewed - No data to display  EKG   Radiology DG Hand Complete Right  Result Date: 09/23/2019 CLINICAL DATA:  24 year old male status post blunt trauma with pain. EXAM: RIGHT HAND - COMPLETE 3+ VIEW COMPARISON:  Right hand series 11/14/2017. FINDINGS: Bone mineralization is within normal limits. There is no evidence of fracture or dislocation. There is no evidence of arthropathy or other focal bone abnormality. No discrete soft tissue injury. IMPRESSION: Negative. Electronically Signed   By: Genevie Ann M.D.   On: 09/23/2019 00:48   DG Foot Complete Right  Result Date: 09/23/2019 CLINICAL DATA:  24 year old male status post blunt trauma with pain. EXAM: RIGHT FOOT COMPLETE - 3+ VIEW COMPARISON:  Right foot series 04/15/2010. FINDINGS: Bone mineralization is within normal limits. There is no evidence of fracture or dislocation. There is no evidence of arthropathy or other focal bone abnormality. No discrete soft tissue injury. IMPRESSION: Negative. Electronically Signed   By: Genevie Ann M.D.   On: 09/23/2019 00:47    Procedures Procedures (including critical care time)  Medications Ordered in UC Medications - No data to display  Initial Impression /  Assessment and Plan / UC Course  I have reviewed the triage vital signs and the nursing notes.  Pertinent labs & imaging results that were available during my care of the patient were reviewed by me and considered in my medical decision making (see chart for details).     Suspect likely contusion/spraining of toe and finger.  No sign of infection at this  time.  Providing postop shoe for toe for comfort, recommending ice elevation and anti-inflammatories.  Monitor for gradual self resolution.  Discussed strict return precautions. Patient verbalized understanding and is agreeable with plan.  Final Clinical Impressions(s) / UC Diagnoses   Final diagnoses:  Contusion of lesser toe of right foot without damage to nail, initial encounter  Pain of finger of right hand     Discharge Instructions     Use anti-inflammatories for pain/swelling. You may take up to 800 mg Ibuprofen every 8 hours with food. You may supplement Ibuprofen with Tylenol [phone] mg every 8 hours.  Ice and elevate foot/toe Follow up if not improving    ED Prescriptions    Medication Sig Dispense Auth. Provider   ibuprofen (ADVIL) 800 MG tablet Take 1 tablet (800 mg total) by mouth 3 (three) times daily. 21 tablet Clemmie Marxen, [place] C, PA-C     PDMP not reviewed this encounter.   Janith Lima, [place] 09/23/19 [phone]

## 2019-10-07 ENCOUNTER — Ambulatory Visit (HOSPITAL_COMMUNITY)
Admission: EM | Admit: 2019-10-07 | Discharge: 2019-10-07 | Disposition: A | Discharge status: Home / Self Care | Payer: Medicaid Other | Attending: Urgent Care | Admitting: Urgent Care

## 2019-10-07 ENCOUNTER — Other Ambulatory Visit: Payer: Self-pay

## 2019-10-07 ENCOUNTER — Encounter (HOSPITAL_COMMUNITY): Payer: Self-pay | Admitting: Emergency Medicine

## 2019-10-07 DIAGNOSIS — J069 Acute upper respiratory infection, unspecified: Secondary | ICD-10-CM | POA: Insufficient documentation

## 2019-10-07 DIAGNOSIS — Z791 Long term (current) use of non-steroidal anti-inflammatories (NSAID): Secondary | ICD-10-CM | POA: Insufficient documentation

## 2019-10-07 DIAGNOSIS — F17210 Nicotine dependence, cigarettes, uncomplicated: Secondary | ICD-10-CM | POA: Insufficient documentation

## 2019-10-07 DIAGNOSIS — F319 Bipolar disorder, unspecified: Secondary | ICD-10-CM | POA: Insufficient documentation

## 2019-10-07 DIAGNOSIS — R112 Nausea with vomiting, unspecified: Secondary | ICD-10-CM | POA: Insufficient documentation

## 2019-10-07 DIAGNOSIS — Z79899 Other long term (current) drug therapy: Secondary | ICD-10-CM | POA: Insufficient documentation

## 2019-10-07 DIAGNOSIS — Z20822 Contact with and (suspected) exposure to covid-19: Secondary | ICD-10-CM | POA: Insufficient documentation

## 2019-10-07 LAB — SARS CORONAVIRUS 2 (TAT 6-24 HRS): SARS Coronavirus 2: NEGATIVE

## 2019-10-07 MED ORDER — ONDANSETRON HCL 4 MG PO TABS: 4.0000 mg | ORAL_TABLET | Freq: Three times a day (TID) | ORAL | 0 refills | Status: DC | PRN

## 2019-10-07 NOTE — ED Triage Notes (Signed)
Pt presents to [facility] for assessment after spending 5 days out of work for cough, nasal congestion, nausea, abdominal pain, looser than normal stools, fever.  States he needs a COVID test to return

## 2019-10-07 NOTE — ED Provider Notes (Signed)
[facility]    CSN: [id number] Arrival date & time: 10/07/19  1012      History   Chief Complaint Chief Complaint  Patient presents with  . URI    HPI Zachary [name] is a 24 y.o. male.   Zachary [name] presents with complaints of cough, runny nose, diarrhea, abdominal pain. Symptoms were predominately a week ago, lasting for a week. Feels better now. No further symptoms. Some mild nausea only. No fevers. No known ill contacts. No history of covid-19 and has not received vaccination. Hasn't been taking any medications for symptoms. Requesting covid testing in order to return to work.    ROS per HPI, negative if not otherwise mentioned.      Past Medical History:  Diagnosis Date  . Anxiety   . 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 Bipolar disorder ([place])   . Clinical diagnosis of COVID-19 02/02/2019    Patient Active Problem List   Diagnosis Date Noted  . Bipolar affective disorder, depressed ([place]) 07/11/2016    Past Surgical History:  Procedure Laterality Date  . HIP SURGERY    . NERVE, TENDON AND ARTERY REPAIR Left 03/19/2019   Procedure: LEFT PALM NERVE, TENDON AND ARTERY REPAIR;  Surgeon: Leanora Cover, MD;  Location: [facility];  Service: Orthopedics;  Laterality: Left;  block in preop       Home Medications    Prior to Admission medications   Medication Sig Start Date End Date Taking? Authorizing Provider  ibuprofen (ADVIL) 800 MG tablet Take 1 tablet (800 mg total) by mouth 3 (three) times daily. 09/23/19   Wieters, Hallie C, PA-C  morphine (MSIR) 15 MG tablet Take 0.5 tablets (7.5 mg total) by mouth every 4 (four) hours as needed for severe pain. 05/11/19   Deno Etienne, DO  ondansetron (ZOFRAN) 4 MG tablet Take 1 tablet (4 mg total) by mouth every 8 (eight) hours as needed for nausea or vomiting. 10/07/19   Zigmund Gottron, NP  oxyCODONE-acetaminophen (PERCOCET) 5-325 MG tablet 1-2 tabs PO q6 hours prn pain  03/19/19   Leanora Cover, MD    Family History History reviewed. No pertinent family history.  Social History Social History   Tobacco Use  . Smoking status: Current Some Day Smoker    Packs/day: 1.00    Types: Cigarettes  . Smokeless tobacco: Never Used  Substance Use Topics  . Alcohol use: Yes    Comment: rare  . Drug use: Yes    Types: Marijuana    Comment: last used 03/18/19 2000     Allergies   Patient has no known allergies.   Review of Systems Review of Systems   Physical Exam Triage Vital Signs ED Triage Vitals  Enc Vitals Group     BP 10/07/19 1140 122/67     Pulse Rate 10/07/19 1140 73     Resp 10/07/19 1140 18     Temp 10/07/19 1140 97.9 F (36.6 C)     Temp Source 10/07/19 1140 Oral     SpO2 10/07/19 1140 100 %     Weight --      Height --      Head Circumference --      Peak Flow --      Pain Score 10/07/19 1138 0     Pain Loc --      Pain Edu? --      Excl. in [place]? --    No data found.  Updated Vital Signs BP 122/67 (BP Location: Right Arm)   Pulse 73   Temp 97.9 F (36.6 C) (Oral)   Resp 18   SpO2 100%   Visual Acuity Right Eye Distance:   Left Eye Distance:   Bilateral Distance:    Right Eye Near:   Left Eye Near:    Bilateral Near:     Physical Exam Constitutional:      Appearance: He is well-developed.  Cardiovascular:     Rate and Rhythm: Normal rate.  Pulmonary:     Effort: Pulmonary effort is normal.  Skin:    General: Skin is warm and dry.  Neurological:     Mental Status: He is alert and oriented to person, place, and time.      UC Treatments / Results  Labs (all labs ordered are listed, but only abnormal results are displayed) Labs Reviewed  SARS CORONAVIRUS 2 (TAT 6-24 HRS)    EKG   Radiology No results found.  Procedures Procedures (including critical care time)  Medications Ordered in UC Medications - No data to display  Initial Impression / Assessment and Plan / UC Course  I have reviewed  the triage vital signs and the nursing notes.  Pertinent labs & imaging results that were available during my care of the patient were reviewed by me and considered in my medical decision making (see chart for details).     Symptoms have resolved from illness last week. covid testing collected and pending. Return precautions provided. Patient verbalized understanding and agreeable to plan.   Final Clinical Impressions(s) / UC Diagnoses   Final diagnoses:  Upper respiratory tract infection, unspecified type  Non-intractable vomiting with nausea, unspecified vomiting type     Discharge Instructions     Self isolate until covid results are back and negative.  Will notify you by phone of any positive findings. Your negative results will be sent through your MyChart.        ED Prescriptions    Medication Sig Dispense Auth. Provider   ondansetron (ZOFRAN) 4 MG tablet Take 1 tablet (4 mg total) by mouth every 8 (eight) hours as needed for nausea or vomiting. 10 tablet Zigmund Gottron, NP     PDMP not reviewed this encounter.   Zigmund Gottron, NP 10/07/19 1241

## 2019-10-07 NOTE — Discharge Instructions (Signed)
Self isolate until covid results are back and negative.  Will notify you by phone of any positive findings. Your negative results will be sent through your MyChart.

## 2020-01-05 ENCOUNTER — Ambulatory Visit (HOSPITAL_COMMUNITY)
Admission: RE | Admit: 2020-01-05 | Discharge: 2020-01-05 | Disposition: A | Discharge status: Home / Self Care | Payer: No Payment, Other | Attending: Psychiatry | Admitting: Psychiatry

## 2020-07-23 ENCOUNTER — Encounter (HOSPITAL_COMMUNITY): Payer: Self-pay

## 2020-07-23 ENCOUNTER — Emergency Department (HOSPITAL_COMMUNITY)
Admission: EM | Admit: 2020-07-23 | Discharge: 2020-07-23 | Disposition: A | Discharge status: Home / Self Care | Payer: Medicaid Other | Attending: Emergency Medicine | Admitting: Emergency Medicine

## 2020-07-23 ENCOUNTER — Other Ambulatory Visit: Payer: Self-pay

## 2020-07-23 DIAGNOSIS — F17210 Nicotine dependence, cigarettes, uncomplicated: Secondary | ICD-10-CM | POA: Insufficient documentation

## 2020-07-23 DIAGNOSIS — Y907 Blood alcohol level of 200-239 mg/100 ml: Secondary | ICD-10-CM | POA: Insufficient documentation

## 2020-07-23 DIAGNOSIS — H11433 Conjunctival hyperemia, bilateral: Secondary | ICD-10-CM | POA: Insufficient documentation

## 2020-07-23 DIAGNOSIS — F10920 Alcohol use, unspecified with intoxication, uncomplicated: Secondary | ICD-10-CM | POA: Insufficient documentation

## 2020-07-23 LAB — CBC WITH DIFFERENTIAL/PLATELET
Abs Immature Granulocytes: 0.12 10*3/uL — ABNORMAL HIGH (ref 0.00–0.07)
Basophils Absolute: 0.1 10*3/uL (ref 0.0–0.1)
Basophils Relative: 0 %
Eosinophils Absolute: 0 10*3/uL (ref 0.0–0.5)
Eosinophils Relative: 0 %
HCT: 50 % (ref 39.0–52.0)
Hemoglobin: 15.6 g/dL (ref 13.0–17.0)
Immature Granulocytes: 1 %
Lymphocytes Relative: 13 %
Lymphs Abs: 1.8 10*3/uL (ref 0.7–4.0)
MCH: 26.1 pg (ref 26.0–34.0)
MCHC: 31.2 g/dL (ref 30.0–36.0)
MCV: 83.6 fL (ref 80.0–100.0)
Monocytes Absolute: 0.6 10*3/uL (ref 0.1–1.0)
Monocytes Relative: 5 %
Neutro Abs: 10.7 10*3/uL — ABNORMAL HIGH (ref 1.7–7.7)
Neutrophils Relative %: 81 %
Platelets: 265 10*3/uL (ref 150–400)
RBC: 5.98 MIL/uL — ABNORMAL HIGH (ref 4.22–5.81)
RDW: 13.3 % (ref 11.5–15.5)
WBC: 13.3 10*3/uL — ABNORMAL HIGH (ref 4.0–10.5)
nRBC: 0 % (ref 0.0–0.2)

## 2020-07-23 LAB — CBG MONITORING, ED: Glucose-Capillary: 125 mg/dL — ABNORMAL HIGH (ref 70–99)

## 2020-07-23 LAB — BASIC METABOLIC PANEL
Anion gap: 14 (ref 5–15)
BUN: 16 mg/dL (ref 6–20)
CO2: 22 mmol/L (ref 22–32)
Calcium: 9.4 mg/dL (ref 8.9–10.3)
Chloride: 103 mmol/L (ref 98–111)
Creatinine, Ser: 1.06 mg/dL (ref 0.61–1.24)
GFR, Estimated: >60 mL/min (ref >60)
Glucose, Bld: 108 mg/dL — ABNORMAL HIGH (ref 70–99)
Potassium: 3.9 mmol/L (ref 3.5–5.1)
Sodium: 139 mmol/L (ref 135–145)

## 2020-07-23 LAB — ETHANOL: Alcohol, Ethyl (B): 217 mg/dL — ABNORMAL HIGH (ref <10)

## 2020-07-23 NOTE — ED Notes (Signed)
Patient's father coming to pick him up

## 2020-07-23 NOTE — ED Triage Notes (Signed)
Patient BIB [facility] after being found in someones grass vomiting. A bunch of CBD gummy packs in his pocket. Patient did not respond to pain with EMS and had pin point pupils.   EMS vitals 20G left AC 124/79 RR 14 97% Room Air CBG 104

## 2020-07-23 NOTE — ED Notes (Signed)
Patient stating that either EMS, [employer], or nursing staff stole his money. He initially states he had $500 in his pockets but later states he had $3000 in single dollar bills. This RN attempted to de-escalate patient but he continued to yell at staff and act aggressively. He states, "I'm suing all you motherfuckers." As patient is escorted out by security, he threatens staff by stating, "When I get back here, it's going to be a problem for all of you."

## 2020-07-23 NOTE — Discharge Instructions (Addendum)
[facility] to be evaluated for your alcohol intoxication.  Your physical exam and lab results were reassuring.  I have given you information to follow-up with outpatient counseling and substance use.  Please schedule an appoint with a counselor to discuss your drinking and marijuana use.  Get help right away if: You have any of the following: Moderate to severe trouble with coordination, speech, memory, or attention. Trouble staying awake. Severe confusion. A seizure. Light-headedness. Fainting. Vomiting bright red blood or material that looks like coffee grounds. Bloody stool (feces). The blood may make your stool bright red, black, or tarry. It may also smell bad. Shakiness when trying to stop drinking. Thoughts about hurting yourself or others.

## 2020-07-23 NOTE — ED Provider Notes (Signed)
[facility] DEPT Provider Note   CSN: [id number] Arrival date & time: 07/23/20  0551     History Chief Complaint  Patient presents with   Alcohol Intoxication    Mehran Guderian is a 25 y.o. male with history of anxiety, bipolar disorder.  Patient smells of alcohol.  Patient is alert to verbal stimuli.  Patient alert to person only.  Patient states "I should not be drinking that much."  Patient unable to provide any further details on alcohol consumption.  Patient denies any illicit drug use.  Patient denies any pain or discomfort.  Per triage note patient brought to emergency department by [facility] after being found in someone's grass vomiting.  CBD gummy packs were found in his pocket.   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 No pertinent family history.  Social History   Tobacco Use   Smoking status: Some Days    Packs/day: 1.00    Pack years: 0.00    Types: Cigarettes   Smokeless tobacco: Never  Substance Use Topics   Alcohol use: Yes    Comment: rare   Drug use: Yes    Types: Marijuana    Comment: last used 03/18/19 2000    Home Medications Prior to Admission medications   Medication Sig Start Date End Date Taking? Authorizing Provider  ibuprofen (ADVIL) 800 MG tablet Take 1 tablet (800 mg total) by mouth 3 (three) times daily.  09/23/19   Wieters, Hallie C, PA-C  morphine (MSIR) 15 MG tablet Take 0.5 tablets (7.5 mg total) by mouth every 4 (four) hours as needed for severe pain. 05/11/19   Deno Etienne, DO  ondansetron (ZOFRAN) 4 MG tablet Take 1 tablet (4 mg total) by mouth every 8 (eight) hours as needed for nausea or vomiting. 10/07/19   Zigmund Gottron, NP  oxyCODONE-acetaminophen (PERCOCET) 5-325 MG tablet 1-2 tabs PO q6 hours prn pain 03/19/19   Leanora Cover, MD    Allergies    Patient has no known allergies.  Review of Systems   Review of Systems  Unable to perform ROS: Other   Physical Exam Updated Vital Signs BP 116/69 (BP Location: Left Arm)   Pulse 78   Temp (!) 97.4 F (36.3 C) (Axillary)   Resp 19   Ht 6' (1.829 m)   Wt 90.7 kg   SpO2 100%   BMI 27.12 kg/m   Physical Exam Vitals and nursing note reviewed.  Constitutional:      General: He is sleeping. He is not in acute distress.    Appearance: He is not ill-appearing, toxic-appearing or diaphoretic.  HENT:     Head: Normocephalic and atraumatic. No raccoon eyes, Battle's sign, abrasion, contusion, masses, right periorbital erythema, left periorbital erythema or laceration.     Jaw: No trismus, tenderness or  pain on movement.     Nose: No nasal tenderness.     Mouth/Throat:     Mouth: Mucous membranes are moist.     Pharynx: Oropharynx is clear. Uvula midline. No pharyngeal swelling, oropharyngeal exudate, posterior oropharyngeal erythema or uvula swelling.  Eyes:     General: No scleral icterus.       Right eye: No discharge.        Left eye: No discharge.     Extraocular Movements: Extraocular movements intact.     Conjunctiva/sclera:     Right eye: Right conjunctiva is injected. No chemosis, exudate or hemorrhage.    Left eye: Left conjunctiva is injected. No chemosis, exudate or hemorrhage.    Pupils: Pupils are equal, round, and reactive to light.     Comments: Pupils 4 mm bilaterally and reactive to light  Cardiovascular:      Rate and Rhythm: Normal rate.  Pulmonary:     Effort: Pulmonary effort is normal. No tachypnea, bradypnea or respiratory distress.     Breath sounds: Normal breath sounds.  Chest:     Chest wall: No mass, lacerations, deformity, swelling, tenderness or crepitus.     Comments: No ecchymosis or contusion Abdominal:     General: There is no distension. There are no signs of injury.     Palpations: Abdomen is soft. There is no mass or pulsatile mass.     Tenderness: There is no abdominal tenderness. There is no guarding or rebound.     Comments: No ecchymosis or contusion  Musculoskeletal:     Cervical back: Neck supple. No swelling, edema, deformity, erythema, signs of trauma, lacerations, rigidity, spasms, torticollis, tenderness or bony tenderness. No pain with movement, spinous process tenderness or muscular tenderness. Normal range of motion.     Thoracic back: No swelling, edema, deformity, signs of trauma, lacerations, spasms, tenderness or bony tenderness.     Lumbar back: No swelling, edema, deformity, signs of trauma, lacerations, spasms, tenderness or bony tenderness.     Comments: No midline tenderness or deformity to cervical, thoracic, or lumbar spine No tenderness, bony tenderness, or deformity to bilateral upper or lower extremities   Skin:    General: Skin is warm and dry.     Coloration: Skin is not ashen, cyanotic, jaundiced or pale.  Neurological:     General: No focal deficit present.     Mental Status: He is easily aroused.     GCS: GCS eye subscore is 3. GCS verbal subscore is 4. GCS motor subscore is 6.     Comments: Patient is alert to person only CN II through XII intact Patient moves all limbs equally without difficulty, equal grip strength, patient able to lift both bilateral lower extremities and hold against gravity without difficulty, +5 strength to dorsiflexion and plantar flexion bilaterally    ED Results / Procedures / Treatments   Labs (all labs ordered  are listed, but only abnormal results are displayed) Labs Reviewed  ETHANOL - Abnormal; Notable for the following components:      Result Value   Alcohol, Ethyl (B) 217 (*)    All other components within normal limits  BASIC METABOLIC PANEL - Abnormal; Notable for the following components:   Glucose, Bld 108 (*)    All other components within normal limits  CBC WITH DIFFERENTIAL/PLATELET - Abnormal; Notable for the following components:   WBC 13.3 (*)    RBC 5.98 (*)    Neutro Abs 10.7 (*)  Abs Immature Granulocytes 0.12 (*)    All other components within normal limits  CBG MONITORING, ED - Abnormal; Notable for the following components:   Glucose-Capillary 125 (*)    All other components within normal limits    EKG None  Radiology No results found.  Procedures Procedures   Medications Ordered in ED Medications - No data to display  ED Course  I have reviewed the triage vital signs and the nursing notes.  Pertinent labs & imaging results that were available during my care of the patient were reviewed by me and considered in my medical decision making (see chart for details).    MDM Rules/Calculators/A&P                          25 year old male in no acute distress, nontoxic-appearing.  Patient is sleeping comfortably on stretcher.  Patient arousable to voice.  Patient had to be alert to person only.  Patient smells of alcohol and marijuana.  Patient was brought to emergency department by [facility] after being found vomiting in someone's yard.  During exam patient states "I should not be drinking that much."  Suspect alcohol intoxication.  Patient denies any illicit drug use.  Patient is atraumatic without any obvious signs of injury.  Will obtain POC CBG, UDS, ethanol, BMP and CBC.  POC CBG 125. Ethanol 217 BMP unremarkable CBC shows leukocytosis at 13.3.  He is afebrile, no obvious source of infection; suspect possible leukocytosis due to acute phase reactants.  Plan  to allow patient to metabolize prior to discharge.  Patient sleeping comfortably no acute distress, vital stable.  Is alert to person place and time.  Patient able to stand and ambulate without difficulty.  Patient continues to deny any pain or injuries.  Will discharge patient at this time.  Final Clinical Impression(s) / ED Diagnoses Final diagnoses:  Alcoholic intoxication without complication [facility])    Rx / DC Orders ED Discharge Orders     None        Dyann Ruddle 07/23/20 1915    Fatima Blank, MD 07/24/20 [phone]

## 2020-08-02 ENCOUNTER — Other Ambulatory Visit: Payer: Self-pay

## 2020-08-02 ENCOUNTER — Ambulatory Visit (HOSPITAL_COMMUNITY)
Admission: EM | Admit: 2020-08-02 | Discharge: 2020-08-02 | Disposition: A | Discharge status: Home / Self Care | Payer: Self-pay | Attending: Internal Medicine | Admitting: Internal Medicine

## 2020-08-02 DIAGNOSIS — Z202 Contact with and (suspected) exposure to infections with a predominantly sexual mode of transmission: Secondary | ICD-10-CM | POA: Insufficient documentation

## 2020-08-02 LAB — POCT URINALYSIS DIPSTICK, ED / UC
Bilirubin Urine: NEGATIVE
Glucose, UA: NEGATIVE mg/dL
Ketones, ur: NEGATIVE mg/dL
Leukocytes,Ua: NEGATIVE
Nitrite: NEGATIVE
Protein, ur: NEGATIVE mg/dL
Specific Gravity, Urine: >=1.03 (ref 1.005–1.030)
Urobilinogen, UA: 0.2 mg/dL (ref 0.0–1.0)
pH: 6.5 (ref 5.0–8.0)

## 2020-08-02 NOTE — ED Provider Notes (Signed)
[place]    CSN: [id number] Arrival date & time: 08/02/20  1434      History   Chief Complaint Chief Complaint  Patient presents with   Dysuria    HPI Ora Mcnatt is a 25 y.o. male.   25 year old male patient presents to urgent care with chief complaint of need to get checked for chlamydia.  States he was with a new partner 3 days prior and she called stating she was positive for chlamydia.  Patient denies any signs or symptoms(no dysuria no penile discharge or pain no testicle swelling no abdominal pain no fever no nausea no vomiting).   The history is provided by the patient. No language interpreter was used.   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 Authorizing Provider  ibuprofen (ADVIL) 800 MG tablet Take 1 tablet (800 mg total) by mouth 3 (three) times daily. 09/23/19   Wieters, Hallie C, PA-C  morphine (MSIR) 15 MG tablet Take 0.5 tablets (7.5 mg total) by mouth every 4 (four) hours as needed for severe pain. 05/11/19   Deno Etienne, DO  ondansetron (ZOFRAN) 4 MG tablet Take 1 tablet (4 mg total) by mouth every 8 (eight) hours as needed for nausea or vomiting. 10/07/19   Zigmund Gottron, NP  oxyCODONE-acetaminophen (PERCOCET) 5-325 MG tablet 1-2 tabs PO q6 hours prn pain 03/19/19   Leanora Cover, MD     Family History No family history on file.  Social History Social History   Tobacco Use   Smoking status: Some Days    Packs/day: 1.00    Pack years: 0.00    Types: Cigarettes   Smokeless tobacco: Never  Substance Use Topics   Alcohol use: Yes    Comment: rare   Drug use: Yes    Types: Marijuana    Comment: last used 03/18/19 2000     Allergies   Patient has no known allergies.   Review of Systems Review of Systems  Genitourinary:  Negative for dysuria, genital sores, penile discharge, penile pain, penile swelling, scrotal swelling and testicular pain.  All other systems reviewed and are negative.   Physical Exam Triage Vital Signs ED Triage Vitals  Enc Vitals Group     BP 08/02/20 1523 124/63     Pulse Rate 08/02/20 1523 (!) 59     Resp 08/02/20 1523 16     Temp 08/02/20 1523 98.3 F (36.8 C)     Temp Source 08/02/20 1523 Oral     SpO2 08/02/20 1523 100 %     Weight --      Height --      Head Circumference --      Peak Flow --      Pain Score 08/02/20 1521 0  Pain Loc --      Pain Edu? --      Excl. in [place]? --    No data found.  Updated Vital Signs BP 124/63 (BP Location: Right Arm)   Pulse (!) 59   Temp 98.3 F (36.8 C) (Oral)   Resp 16   SpO2 100%   Visual Acuity Right Eye Distance:   Left Eye Distance:   Bilateral Distance:    Right Eye Near:   Left Eye Near:    Bilateral Near:     Physical Exam Vitals and nursing note reviewed.  Constitutional:      General: He is not in acute distress.    Appearance: Normal appearance. He is well-developed. He is not ill-appearing or toxic-appearing.  HENT:     Head: Normocephalic.     Right Ear: Tympanic membrane normal.     Left Ear: Tympanic membrane normal.     Nose: Nose normal.     Mouth/Throat:     Mouth: Mucous membranes are not dry.     Pharynx: Uvula midline.  Eyes:     General: Lids are normal.     Conjunctiva/sclera: Conjunctivae normal.     Pupils: Pupils are equal, round,  and reactive to light.  Neck:     Trachea: Trachea normal.  Cardiovascular:     Rate and Rhythm: Normal rate and regular rhythm.     Pulses: Normal pulses.     Heart sounds: Normal heart sounds.  Pulmonary:     Effort: Pulmonary effort is normal.     Breath sounds: Normal breath sounds.  Abdominal:     General: Abdomen is flat. Bowel sounds are normal.     Palpations: Abdomen is soft.     Tenderness: There is no abdominal tenderness. There is no guarding or rebound.     Hernia: No hernia is present.  Musculoskeletal:        General: Normal range of motion.     Cervical back: Full passive range of motion without pain and normal range of motion.  Skin:    General: Skin is warm and dry.     Findings: No rash.  Neurological:     General: No focal deficit present.     Mental Status: He is alert and oriented to person, place, and time.     GCS: GCS eye subscore is 4. GCS verbal subscore is 5. GCS motor subscore is 6.  Psychiatric:        Attention and Perception: Attention normal.        Mood and Affect: Mood normal.        Speech: Speech normal.        Behavior: Behavior normal. Behavior is cooperative.     UC Treatments / Results  Labs (all labs ordered are listed, but only abnormal results are displayed) Labs Reviewed  POCT URINALYSIS DIPSTICK, ED / UC - Abnormal; Notable for the following components:      Result Value   Hgb urine dipstick TRACE (*)    All other components within normal limits  CYTOLOGY, (ORAL, ANAL, URETHRAL) ANCILLARY ONLY    EKG   Radiology No results found.  Procedures Procedures (including critical care time)  Medications Ordered in UC Medications - No data to display  Initial Impression / Assessment and Plan / UC Course  I have reviewed the triage vital signs and the nursing notes.  Pertinent labs & imaging results that were available during my care of the patient were  reviewed by me and considered in my medical decision making (see chart  for details).     DDX: STI, worried well Final Clinical Impressions(s) / UC Diagnoses   Final diagnoses:  Possible exposure to STD     Discharge Instructions      No sex until resulted and treated, results usually 7-10 days. Condoms with all future sexual activity. Await results and treatment if necessary.      ED Prescriptions   None    PDMP not reviewed this encounter.   Tori Milks, NP 93/96/88 2002

## 2020-08-02 NOTE — Discharge Instructions (Signed)
No sex until resulted and treated, results usually 7-10 days. Condoms with all future sexual activity. Await results and treatment if necessary.

## 2020-08-02 NOTE — ED Triage Notes (Signed)
Pt presents with dysuria that started this am.

## 2020-08-03 LAB — CYTOLOGY, (ORAL, ANAL, URETHRAL) ANCILLARY ONLY
Chlamydia: POSITIVE — AB
Comment: NEGATIVE
Comment: NORMAL
Neisseria Gonorrhea: NEGATIVE

## 2020-08-05 ENCOUNTER — Telehealth (HOSPITAL_COMMUNITY): Payer: Self-pay | Admitting: Emergency Medicine

## 2020-08-05 MED ORDER — DOXYCYCLINE HYCLATE 100 MG PO CAPS: 100.0000 mg | ORAL_CAPSULE | Freq: Two times a day (BID) | ORAL | 0 refills | Status: AC

## 2021-09-17 ENCOUNTER — Emergency Department (HOSPITAL_COMMUNITY): Payer: Medicaid Other

## 2021-09-17 ENCOUNTER — Inpatient Hospital Stay (HOSPITAL_COMMUNITY)
Admission: EM | Admit: 2021-09-17 | Discharge: 2021-09-18 | DRG: 605 | Disposition: A | Discharge status: Home / Self Care | Payer: Medicaid Other | Attending: General Surgery | Admitting: General Surgery

## 2021-09-17 DIAGNOSIS — F909 Attention-deficit hyperactivity disorder, unspecified type: Secondary | ICD-10-CM | POA: Diagnosis present

## 2021-09-17 DIAGNOSIS — Y249XXA Unspecified firearm discharge, undetermined intent, initial encounter: Secondary | ICD-10-CM

## 2021-09-17 DIAGNOSIS — S71132A Puncture wound without foreign body, left thigh, initial encounter: Principal | ICD-10-CM | POA: Diagnosis present

## 2021-09-17 DIAGNOSIS — I10 Essential (primary) hypertension: Secondary | ICD-10-CM | POA: Diagnosis present

## 2021-09-17 DIAGNOSIS — Z8616 Personal history of COVID-19: Secondary | ICD-10-CM

## 2021-09-17 DIAGNOSIS — F419 Anxiety disorder, unspecified: Secondary | ICD-10-CM | POA: Diagnosis present

## 2021-09-17 DIAGNOSIS — W3400XA Accidental discharge from unspecified firearms or gun, initial encounter: Secondary | ICD-10-CM

## 2021-09-17 DIAGNOSIS — F319 Bipolar disorder, unspecified: Secondary | ICD-10-CM | POA: Diagnosis present

## 2021-09-17 DIAGNOSIS — F17210 Nicotine dependence, cigarettes, uncomplicated: Secondary | ICD-10-CM | POA: Diagnosis present

## 2021-09-17 DIAGNOSIS — R61 Generalized hyperhidrosis: Secondary | ICD-10-CM | POA: Diagnosis present

## 2021-09-17 DIAGNOSIS — S31823A Puncture wound without foreign body of left buttock, initial encounter: Secondary | ICD-10-CM | POA: Diagnosis present

## 2021-09-17 LAB — COMPREHENSIVE METABOLIC PANEL
ALT: 27 U/L (ref 0–44)
AST: 33 U/L (ref 15–41)
Albumin: 4.2 g/dL (ref 3.5–5.0)
Alkaline Phosphatase: 64 U/L (ref 38–126)
Anion gap: 20 — ABNORMAL HIGH (ref 5–15)
BUN: 13 mg/dL (ref 6–20)
CO2: 12 mmol/L — ABNORMAL LOW (ref 22–32)
Calcium: 9.6 mg/dL (ref 8.9–10.3)
Chloride: 107 mmol/L (ref 98–111)
Creatinine, Ser: 1.33 mg/dL — ABNORMAL HIGH (ref 0.61–1.24)
GFR, Estimated: >60 mL/min (ref >60)
Glucose, Bld: 145 mg/dL — ABNORMAL HIGH (ref 70–99)
Potassium: 3.3 mmol/L — ABNORMAL LOW (ref 3.5–5.1)
Sodium: 139 mmol/L (ref 135–145)
Total Bilirubin: 0.7 mg/dL (ref 0.3–1.2)
Total Protein: 7.3 g/dL (ref 6.5–8.1)

## 2021-09-17 LAB — I-STAT CHEM 8, ED
BUN: 13 mg/dL (ref 6–20)
Calcium, Ion: 1.16 mmol/L (ref 1.15–1.40)
Chloride: 109 mmol/L (ref 98–111)
Creatinine, Ser: 1.1 mg/dL (ref 0.61–1.24)
Glucose, Bld: 134 mg/dL — ABNORMAL HIGH (ref 70–99)
HCT: 47 % (ref 39.0–52.0)
Hemoglobin: 16 g/dL (ref 13.0–17.0)
Potassium: 3.3 mmol/L — ABNORMAL LOW (ref 3.5–5.1)
Sodium: 141 mmol/L (ref 135–145)
TCO2: 12 mmol/L — ABNORMAL LOW (ref 22–32)

## 2021-09-17 LAB — LACTIC ACID, PLASMA
Lactic Acid, Venous: >9 mmol/L (ref 0.5–1.9)
Lactic Acid, Venous: 1.5 mmol/L (ref 0.5–1.9)

## 2021-09-17 LAB — CBC
HCT: 47.6 % (ref 39.0–52.0)
HCT: 42.3 % (ref 39.0–52.0)
Hemoglobin: 14.6 g/dL (ref 13.0–17.0)
Hemoglobin: 13.8 g/dL (ref 13.0–17.0)
MCH: 25.9 pg — ABNORMAL LOW (ref 26.0–34.0)
MCH: 26.1 pg (ref 26.0–34.0)
MCHC: 30.7 g/dL (ref 30.0–36.0)
MCHC: 32.6 g/dL (ref 30.0–36.0)
MCV: 84.4 fL (ref 80.0–100.0)
MCV: 80 fL (ref 80.0–100.0)
Platelets: 267 10*3/uL (ref 150–400)
Platelets: 239 10*3/uL (ref 150–400)
RBC: 5.64 MIL/uL (ref 4.22–5.81)
RBC: 5.29 MIL/uL (ref 4.22–5.81)
RDW: 13.2 % (ref 11.5–15.5)
RDW: 13.3 % (ref 11.5–15.5)
WBC: 12.7 10*3/uL — ABNORMAL HIGH (ref 4.0–10.5)
WBC: 13.2 10*3/uL — ABNORMAL HIGH (ref 4.0–10.5)
nRBC: 0 % (ref 0.0–0.2)
nRBC: 0 % (ref 0.0–0.2)

## 2021-09-17 LAB — RESP PANEL BY RT-PCR (FLU A&B, COVID) ARPGX2
Influenza A by PCR: NEGATIVE
Influenza B by PCR: NEGATIVE
SARS Coronavirus 2 by RT PCR: NEGATIVE

## 2021-09-17 LAB — ETHANOL: Alcohol, Ethyl (B): <10 mg/dL (ref <10)

## 2021-09-17 LAB — PROTIME-INR
INR: 1.1 (ref 0.8–1.2)
Prothrombin Time: 13.6 seconds (ref 11.4–15.2)

## 2021-09-17 MED ORDER — ONDANSETRON HCL 4 MG/2ML IJ SOLN
4.0000 mg | Freq: Four times a day (QID) | INTRAMUSCULAR | Status: DC | PRN
  Administered 2021-09-17 – 2021-09-18 (×2): 4 mg via INTRAVENOUS
  Filled 2021-09-17 (×2): qty 2

## 2021-09-17 MED ORDER — FENTANYL CITRATE PF 50 MCG/ML IJ SOSY
PREFILLED_SYRINGE | INTRAMUSCULAR | Status: AC
  Administered 2021-09-17: 50 ug via INTRAVENOUS
  Filled 2021-09-17: qty 1

## 2021-09-17 MED ORDER — OXYCODONE HCL 5 MG PO TABS
5.0000 mg | ORAL_TABLET | ORAL | Status: DC | PRN
  Administered 2021-09-17 – 2021-09-18 (×4): 5 mg via ORAL
  Filled 2021-09-17 (×5): qty 1

## 2021-09-17 MED ORDER — ACETAMINOPHEN 500 MG PO TABS
1000.0000 mg | ORAL_TABLET | Freq: Four times a day (QID) | ORAL | Status: DC
  Administered 2021-09-17 – 2021-09-18 (×4): 1000 mg via ORAL
  Filled 2021-09-17 (×4): qty 2

## 2021-09-17 MED ORDER — IOHEXOL 350 MG/ML SOLN
100.0000 mL | Freq: Once | INTRAVENOUS | Status: AC | PRN
  Administered 2021-09-17: 100 mL via INTRAVENOUS

## 2021-09-17 MED ORDER — METHOCARBAMOL 500 MG PO TABS
500.0000 mg | ORAL_TABLET | Freq: Three times a day (TID) | ORAL | Status: DC | PRN
  Administered 2021-09-17 – 2021-09-18 (×2): 500 mg via ORAL
  Filled 2021-09-17 (×2): qty 1

## 2021-09-17 MED ORDER — SODIUM CHLORIDE 0.9 % IV SOLN: INTRAVENOUS | Status: DC

## 2021-09-17 MED ORDER — METHOCARBAMOL 1000 MG/10ML IJ SOLN: 500.0000 mg | Freq: Three times a day (TID) | INTRAVENOUS | Status: DC | PRN

## 2021-09-17 MED ORDER — DIPHENHYDRAMINE HCL 25 MG PO CAPS: 25.0000 mg | ORAL_CAPSULE | Freq: Three times a day (TID) | ORAL | Status: DC | PRN

## 2021-09-17 MED ORDER — FENTANYL CITRATE PF 50 MCG/ML IJ SOSY: 50.0000 ug | PREFILLED_SYRINGE | Freq: Once | INTRAMUSCULAR | Status: AC

## 2021-09-17 MED ORDER — ONDANSETRON 4 MG PO TBDP: 4.0000 mg | ORAL_TABLET | Freq: Four times a day (QID) | ORAL | Status: DC | PRN
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
## 2021-09-17 NOTE — ED Notes (Signed)
Two units of blood pulled from the blood fridge and only one given
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
## 2021-09-18 ENCOUNTER — Other Ambulatory Visit (HOSPITAL_COMMUNITY): Payer: Self-pay

## 2021-09-18 LAB — TYPE AND SCREEN
ABO/RH(D): O POS
Antibody Screen: NEGATIVE
Unit division: 0
Unit division: 0

## 2021-09-18 LAB — CBC
HCT: 40.5 % (ref 39.0–52.0)
Hemoglobin: 13.5 g/dL (ref 13.0–17.0)
MCH: 26.5 pg (ref 26.0–34.0)
MCHC: 33.3 g/dL (ref 30.0–36.0)
MCV: 79.4 fL — ABNORMAL LOW (ref 80.0–100.0)
Platelets: 214 10*3/uL (ref 150–400)
RBC: 5.1 MIL/uL (ref 4.22–5.81)
RDW: 13.3 % (ref 11.5–15.5)
WBC: 11.8 10*3/uL — ABNORMAL HIGH (ref 4.0–10.5)
nRBC: 0 % (ref 0.0–0.2)

## 2021-09-18 LAB — PREPARE FRESH FROZEN PLASMA
Unit division: 0
Unit division: 0

## 2021-09-18 LAB — URINALYSIS, ROUTINE W REFLEX MICROSCOPIC
Bilirubin Urine: NEGATIVE
Glucose, UA: NEGATIVE mg/dL
Hgb urine dipstick: NEGATIVE
Ketones, ur: 5 mg/dL — AB
Nitrite: NEGATIVE
Protein, ur: NEGATIVE mg/dL
Specific Gravity, Urine: 1.018 (ref 1.005–1.030)
pH: 5 (ref 5.0–8.0)

## 2021-09-18 LAB — BPAM FFP
Blood Product Expiration Date: 202308142359
Blood Product Expiration Date: 202308152359
ISSUE DATE / TIME: 202308061356
ISSUE DATE / TIME: 202308061356
Unit Type and Rh: 6200
Unit Type and Rh: 6200

## 2021-09-18 LAB — BASIC METABOLIC PANEL
Anion gap: 8 (ref 5–15)
BUN: 8 mg/dL (ref 6–20)
CO2: 22 mmol/L (ref 22–32)
Calcium: 8.9 mg/dL (ref 8.9–10.3)
Chloride: 106 mmol/L (ref 98–111)
Creatinine, Ser: 0.89 mg/dL (ref 0.61–1.24)
GFR, Estimated: >60 mL/min (ref >60)
Glucose, Bld: 111 mg/dL — ABNORMAL HIGH (ref 70–99)
Potassium: 3.6 mmol/L (ref 3.5–5.1)
Sodium: 136 mmol/L (ref 135–145)

## 2021-09-18 LAB — BLOOD PRODUCT ORDER (VERBAL) VERIFICATION

## 2021-09-18 LAB — BPAM RBC
Blood Product Expiration Date: 202309072359
Blood Product Expiration Date: 202309092359
ISSUE DATE / TIME: 202308061347
ISSUE DATE / TIME: 202308061347
Unit Type and Rh: 5100
Unit Type and Rh: 5100

## 2021-09-18 LAB — ABO/RH: ABO/RH(D): O POS

## 2021-09-18 MED ORDER — OXYCODONE HCL 5 MG PO TABS
5.0000 mg | ORAL_TABLET | ORAL | Status: DC | PRN
  Administered 2021-09-18: 10 mg via ORAL
  Filled 2021-09-18: qty 2

## 2021-09-18 MED ORDER — ENOXAPARIN SODIUM 30 MG/0.3ML IJ SOSY
30.0000 mg | PREFILLED_SYRINGE | Freq: Two times a day (BID) | INTRAMUSCULAR | Status: DC
  Administered 2021-09-18: 30 mg via SUBCUTANEOUS
  Filled 2021-09-18: qty 0.3

## 2021-09-18 MED ORDER — GABAPENTIN 600 MG PO TABS
300.0000 mg | ORAL_TABLET | Freq: Three times a day (TID) | ORAL | Status: DC
Start: 2021-09-18 — End: 2021-09-18
  Administered 2021-09-18 (×2): 300 mg via ORAL
  Filled 2021-09-18 (×2): qty 1

## 2021-09-18 MED ORDER — OXYCODONE HCL 10 MG PO TABS
10.0000 mg | ORAL_TABLET | Freq: Four times a day (QID) | ORAL | 0 refills | Status: DC | PRN
  Filled 2021-09-18: qty 15, 4d supply, fill #0

## 2021-09-18 MED ORDER — GABAPENTIN 300 MG PO CAPS
300.0000 mg | ORAL_CAPSULE | Freq: Three times a day (TID) | ORAL | 0 refills | Status: DC
  Filled 2021-09-18: qty 90, 30d supply, fill #0

## 2021-09-18 MED ORDER — ACETAMINOPHEN 500 MG PO TABS
1000.0000 mg | ORAL_TABLET | Freq: Four times a day (QID) | ORAL | 0 refills | Status: AC
  Filled 2021-09-18: qty 30, 4d supply, fill #0

## 2021-09-18 MED ORDER — POLYETHYLENE GLYCOL 3350 17 G PO PACK: 17.0000 g | PACK | Freq: Every day | ORAL | Status: DC | PRN

## 2021-09-18 MED ORDER — DOCUSATE SODIUM 100 MG PO CAPS
100.0000 mg | ORAL_CAPSULE | Freq: Two times a day (BID) | ORAL | Status: DC
  Administered 2021-09-18: 100 mg via ORAL
  Filled 2021-09-18: qty 1

## 2021-09-18 MED ORDER — METHOCARBAMOL 500 MG PO TABS
1000.0000 mg | ORAL_TABLET | Freq: Four times a day (QID) | ORAL | Status: DC
  Administered 2021-09-18 (×2): 1000 mg via ORAL
  Filled 2021-09-18 (×2): qty 2

## 2021-09-18 MED ORDER — METHOCARBAMOL 500 MG PO TABS
1000.0000 mg | ORAL_TABLET | Freq: Three times a day (TID) | ORAL | 0 refills | Status: DC | PRN
  Filled 2021-09-18: qty 30, 5d supply, fill #0

## 2021-09-18 MED ORDER — IBUPROFEN 600 MG PO TABS
600.0000 mg | ORAL_TABLET | Freq: Three times a day (TID) | ORAL | 0 refills | Status: AC | PRN
  Filled 2021-09-18: qty 21, 7d supply, fill #0

## 2021-09-18 MED ORDER — METHOCARBAMOL 1000 MG/10ML IJ SOLN: 500.0000 mg | Freq: Four times a day (QID) | INTRAVENOUS | Status: DC

## 2021-09-18 MED ORDER — POLYETHYLENE GLYCOL 3350 17 GM/SCOOP PO POWD
17.0000 g | Freq: Every day | ORAL | 0 refills | Status: AC | PRN
  Filled 2021-09-18: qty 238, 14d supply, fill #0

## 2021-09-18 MED ORDER — IBUPROFEN 600 MG PO TABS
600.0000 mg | ORAL_TABLET | Freq: Four times a day (QID) | ORAL | Status: DC
Start: 2021-09-18 — End: 2021-09-18
  Administered 2021-09-18 (×2): 600 mg via ORAL
  Filled 2021-09-18 (×2): qty 1
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
## 2021-09-18 NOTE — Progress Notes (Signed)
Discharge instructions provided to patient, medications reviewed. Equipment delivered patient discharged
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
## 2022-04-30 ENCOUNTER — Ambulatory Visit
Admission: EM | Admit: 2022-04-30 | Discharge: 2022-04-30 | Disposition: A | Discharge status: Home / Self Care | Payer: Medicaid Other | Attending: Physician Assistant | Admitting: Physician Assistant

## 2022-04-30 ENCOUNTER — Encounter: Payer: Self-pay | Admitting: Emergency Medicine

## 2022-04-30 DIAGNOSIS — M25552 Pain in left hip: Secondary | ICD-10-CM | POA: Diagnosis present

## 2022-04-30 DIAGNOSIS — N342 Other urethritis: Secondary | ICD-10-CM | POA: Diagnosis present

## 2022-04-30 DIAGNOSIS — Z113 Encounter for screening for infections with a predominantly sexual mode of transmission: Secondary | ICD-10-CM | POA: Diagnosis present

## 2022-04-30 DIAGNOSIS — M79605 Pain in left leg: Secondary | ICD-10-CM | POA: Insufficient documentation

## 2022-04-30 DIAGNOSIS — N39498 Other specified urinary incontinence: Secondary | ICD-10-CM | POA: Diagnosis present

## 2022-04-30 LAB — POCT URINALYSIS DIP (MANUAL ENTRY)
Bilirubin, UA: NEGATIVE
Glucose, UA: NEGATIVE mg/dL
Ketones, POC UA: NEGATIVE mg/dL
Nitrite, UA: NEGATIVE
Protein Ur, POC: 30 mg/dL — AB
Spec Grav, UA: >=1.03 — AB (ref 1.010–1.025)
Urobilinogen, UA: 0.2 E.U./dL
pH, UA: 6.5 (ref 5.0–8.0)

## 2022-04-30 MED ORDER — HYDROCODONE-ACETAMINOPHEN 5-325 MG PO TABS: 2.0000 | ORAL_TABLET | Freq: Three times a day (TID) | ORAL | 0 refills | Status: AC

## 2022-04-30 MED ORDER — DULOXETINE HCL 20 MG PO CPEP: 20.0000 mg | ORAL_CAPSULE | Freq: Two times a day (BID) | ORAL | 1 refills | Status: AC

## 2022-04-30 MED ORDER — CEFTRIAXONE SODIUM 500 MG IJ SOLR
500.0000 mg | INTRAMUSCULAR | Status: DC
  Administered 2022-04-30: 500 mg via INTRAMUSCULAR

## 2022-04-30 MED ORDER — DOXYCYCLINE HYCLATE 100 MG PO CAPS: 100.0000 mg | ORAL_CAPSULE | Freq: Two times a day (BID) | ORAL | 0 refills | Status: AC

## 2022-04-30 MED ORDER — METHOCARBAMOL 500 MG PO TABS: 1000.0000 mg | ORAL_TABLET | Freq: Three times a day (TID) | ORAL | 0 refills | Status: AC | PRN

## 2022-04-30 MED ORDER — GABAPENTIN 300 MG PO CAPS: 300.0000 mg | ORAL_CAPSULE | Freq: Three times a day (TID) | ORAL | 0 refills | Status: AC
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
## 2022-04-30 NOTE — Discharge Instructions (Addendum)
Advised to take Cymbalta 20 mg, 1 capsule twice daily for pain relief. Advised take the Neurontin 300 mg, 1 capsule 3 times a day for pain relief. Advised take Robaxin 500 mg 1-2 tablets every 8 hours as needed for pain relief and muscle spasm. Advised take the Vicodin tablets, 5/325, 1-2 every 6-8 hours needed for pain relief.  Advised to contact either [facility] or [facility] for evaluation of left hip pain and discomfort.  They have a walk-in urgent care clinic that she can walk-in and no appointment is needed.  Urology will be calling you within the next 48-72 hours to schedule an appointment for evaluation of the urinary symptoms.  Advised take doxycycline 100 mg every 12 hours to treat urinary infection.  Lab test will be completed in 48 hours.  If you do not get a call from this office that indicates the test is negative.  Log onto MyChart to be the test results when it post in 48 hours.

## 2022-05-01 LAB — RPR: RPR Ser Ql: NONREACTIVE

## 2022-05-01 LAB — CYTOLOGY, (ORAL, ANAL, URETHRAL) ANCILLARY ONLY
Chlamydia: POSITIVE — AB
Comment: NEGATIVE
Comment: NEGATIVE
Comment: NORMAL
Neisseria Gonorrhea: POSITIVE — AB
Trichomonas: NEGATIVE

## 2022-05-01 LAB — HIV ANTIBODY (ROUTINE TESTING W REFLEX): HIV Screen 4th Generation wRfx: NONREACTIVE

## 2022-05-10 ENCOUNTER — Encounter: Payer: Self-pay | Admitting: Urology

## 2022-05-10 ENCOUNTER — Encounter: Payer: Medicaid Other | Admitting: Urology

## 2022-10-24 ENCOUNTER — Ambulatory Visit: Payer: Medicaid Other

## 2023-01-25 ENCOUNTER — Other Ambulatory Visit: Payer: Self-pay

## 2023-01-25 ENCOUNTER — Emergency Department (HOSPITAL_COMMUNITY)
Admission: EM | Admit: 2023-01-25 | Discharge: 2023-01-25 | Discharge status: Against Medical Advice | Payer: Medicaid Other | Attending: Emergency Medicine | Admitting: Emergency Medicine

## 2023-01-25 DIAGNOSIS — Z5321 Procedure and treatment not carried out due to patient leaving prior to being seen by health care provider: Secondary | ICD-10-CM | POA: Diagnosis not present

## 2023-01-25 DIAGNOSIS — Z202 Contact with and (suspected) exposure to infections with a predominantly sexual mode of transmission: Secondary | ICD-10-CM | POA: Diagnosis not present

## 2023-01-25 DIAGNOSIS — M79605 Pain in left leg: Secondary | ICD-10-CM | POA: Diagnosis present
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
## 2023-01-25 NOTE — ED Notes (Signed)
Called pt for vitals no answer X3 

## 2023-01-25 NOTE — ED Notes (Signed)
Called for vitals no answer X2 

## 2023-01-25 NOTE — ED Notes (Signed)
Called for vitals no answer X1 

## 2023-02-09 ENCOUNTER — Emergency Department (HOSPITAL_COMMUNITY)
Admission: EM | Admit: 2023-02-09 | Discharge: 2023-02-10 | Discharge status: Against Medical Advice | Payer: Medicaid Other | Attending: Student | Admitting: Student

## 2023-02-09 ENCOUNTER — Other Ambulatory Visit: Payer: Self-pay

## 2023-02-09 ENCOUNTER — Encounter (HOSPITAL_COMMUNITY): Payer: Self-pay | Admitting: Emergency Medicine

## 2023-02-09 DIAGNOSIS — R079 Chest pain, unspecified: Secondary | ICD-10-CM | POA: Diagnosis not present

## 2023-02-09 DIAGNOSIS — Z5321 Procedure and treatment not carried out due to patient leaving prior to being seen by health care provider: Secondary | ICD-10-CM | POA: Insufficient documentation

## 2023-02-09 DIAGNOSIS — R519 Headache, unspecified: Secondary | ICD-10-CM | POA: Diagnosis present

## 2023-02-09 DIAGNOSIS — R55 Syncope and collapse: Secondary | ICD-10-CM | POA: Diagnosis not present
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
## 2023-02-10 ENCOUNTER — Emergency Department (HOSPITAL_COMMUNITY): Payer: Medicaid Other

## 2023-02-10 LAB — CBC WITH DIFFERENTIAL/PLATELET
Abs Immature Granulocytes: 0.07 10*3/uL (ref 0.00–0.07)
Basophils Absolute: 0.1 10*3/uL (ref 0.0–0.1)
Basophils Relative: 1 %
Eosinophils Absolute: 0.1 10*3/uL (ref 0.0–0.5)
Eosinophils Relative: 1 %
HCT: 46.5 % (ref 39.0–52.0)
Hemoglobin: 15.1 g/dL (ref 13.0–17.0)
Immature Granulocytes: 1 %
Lymphocytes Relative: 31 %
Lymphs Abs: 3.8 10*3/uL (ref 0.7–4.0)
MCH: 26.1 pg (ref 26.0–34.0)
MCHC: 32.5 g/dL (ref 30.0–36.0)
MCV: 80.3 fL (ref 80.0–100.0)
Monocytes Absolute: 0.9 10*3/uL (ref 0.1–1.0)
Monocytes Relative: 7 %
Neutro Abs: 7.4 10*3/uL (ref 1.7–7.7)
Neutrophils Relative %: 59 %
Platelets: 267 10*3/uL (ref 150–400)
RBC: 5.79 MIL/uL (ref 4.22–5.81)
RDW: 12.9 % (ref 11.5–15.5)
WBC: 12.4 10*3/uL — ABNORMAL HIGH (ref 4.0–10.5)
nRBC: 0 % (ref 0.0–0.2)

## 2023-02-10 LAB — COMPREHENSIVE METABOLIC PANEL
ALT: 50 U/L — ABNORMAL HIGH (ref 0–44)
AST: 34 U/L (ref 15–41)
Albumin: 4.3 g/dL (ref 3.5–5.0)
Alkaline Phosphatase: 66 U/L (ref 38–126)
Anion gap: 12 (ref 5–15)
BUN: 16 mg/dL (ref 6–20)
CO2: 20 mmol/L — ABNORMAL LOW (ref 22–32)
Calcium: 9.5 mg/dL (ref 8.9–10.3)
Chloride: 107 mmol/L (ref 98–111)
Creatinine, Ser: 1 mg/dL (ref 0.61–1.24)
GFR, Estimated: >60 mL/min (ref >60)
Glucose, Bld: 96 mg/dL (ref 70–99)
Potassium: 3.9 mmol/L (ref 3.5–5.1)
Sodium: 139 mmol/L (ref 135–145)
Total Bilirubin: 0.7 mg/dL (ref <1.2)
Total Protein: 7.5 g/dL (ref 6.5–8.1)

## 2023-02-10 LAB — TROPONIN I (HIGH SENSITIVITY)
Troponin I (High Sensitivity): 15 ng/L (ref <18)
Troponin I (High Sensitivity): 18 ng/L — ABNORMAL HIGH (ref <18)

## 2023-02-10 MED ORDER — IBUPROFEN 400 MG PO TABS
600.0000 mg | ORAL_TABLET | Freq: Once | ORAL | Status: AC
  Administered 2023-02-10: 600 mg via ORAL
  Filled 2023-02-10: qty 1

## 2023-02-10 MED ORDER — METOCLOPRAMIDE HCL 10 MG PO TABS
10.0000 mg | ORAL_TABLET | Freq: Once | ORAL | Status: AC
  Administered 2023-02-10: 10 mg via ORAL
  Filled 2023-02-10: qty 1

## 2023-02-10 NOTE — ED Notes (Signed)
Mom asked for son to call when he goes back to a room because she is in [place] with her oter children. Number is [phone]

## 2023-02-10 NOTE — ED Notes (Signed)
Pt called x3 to bring bback to room to be seen by provider, no answer
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
## 2023-02-18 ENCOUNTER — Ambulatory Visit: Payer: Medicaid Other

## 2023-03-01 ENCOUNTER — Other Ambulatory Visit (HOSPITAL_COMMUNITY): Payer: Self-pay

## 2023-06-21 ENCOUNTER — Other Ambulatory Visit: Payer: Self-pay

## 2023-06-21 ENCOUNTER — Ambulatory Visit
Admission: EM | Admit: 2023-06-21 | Discharge: 2023-06-21 | Disposition: A | Discharge status: Home / Self Care | Attending: Internal Medicine | Admitting: Internal Medicine

## 2023-06-21 VITALS — BP 135/82 | HR 71 | Temp 98.2°F | Resp 18

## 2023-06-21 DIAGNOSIS — Z9189 Other specified personal risk factors, not elsewhere classified: Secondary | ICD-10-CM | POA: Insufficient documentation

## 2023-06-21 DIAGNOSIS — B350 Tinea barbae and tinea capitis: Secondary | ICD-10-CM | POA: Insufficient documentation

## 2023-06-21 MED ORDER — KETOCONAZOLE 2 % EX SHAM: 1.0000 | MEDICATED_SHAMPOO | CUTANEOUS | 0 refills | Status: AC

## 2023-06-21 NOTE — ED Triage Notes (Signed)
 Pt here for dysuria x 3 days and penile discharge; pt also sts rash at scalp line this is itchy
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
## 2023-06-21 NOTE — Discharge Instructions (Signed)
 STD testing pending, this will take 2-3 days to result. We will only call you if your testing is positive for any infection(s) and we will provide treatment.  Avoid sexual intercourse until your STD results come back.  If any of your STD results are positive, you will need to avoid sexual intercourse for 7 days while you are being treated to prevent spread of STD.  Condom use is the best way to prevent spread of STDs. Notify partner(s) of any positive results.  Return to urgent care as needed.   Your scalp rash is due to a fungal infection. Use ketoconazole shampoo every 3 days for the next 4 weeks as prescribed. Schedule a follow-up appointment with your PCP for this.

## 2023-06-22 LAB — HIV ANTIBODY (ROUTINE TESTING W REFLEX): HIV Screen 4th Generation wRfx: NONREACTIVE

## 2023-06-22 LAB — RPR: RPR Ser Ql: NONREACTIVE

## 2023-06-24 ENCOUNTER — Telehealth (HOSPITAL_COMMUNITY): Payer: Self-pay

## 2023-06-24 LAB — CYTOLOGY, (ORAL, ANAL, URETHRAL) ANCILLARY ONLY
Chlamydia: POSITIVE — AB
Comment: NEGATIVE
Comment: NEGATIVE
Comment: NORMAL
Neisseria Gonorrhea: NEGATIVE
Trichomonas: NEGATIVE

## 2023-06-24 MED ORDER — DOXYCYCLINE HYCLATE 100 MG PO TABS: 100.0000 mg | ORAL_TABLET | Freq: Two times a day (BID) | ORAL | 0 refills | Status: AC

## 2023-06-24 NOTE — Telephone Encounter (Signed)
 Per protocol, pt requires tx with Doxycycline.  Reviewed with patient, verified pharmacy, prescription sent.

## 2023-08-17 ENCOUNTER — Emergency Department (HOSPITAL_COMMUNITY)

## 2023-08-17 ENCOUNTER — Encounter (HOSPITAL_COMMUNITY): Payer: Self-pay

## 2023-08-17 ENCOUNTER — Other Ambulatory Visit: Payer: Self-pay

## 2023-08-17 ENCOUNTER — Emergency Department (HOSPITAL_COMMUNITY)
Admission: EM | Admit: 2023-08-17 | Discharge: 2023-08-17 | Disposition: A | Discharge status: Home / Self Care | Attending: Emergency Medicine | Admitting: Emergency Medicine

## 2023-08-17 DIAGNOSIS — F17210 Nicotine dependence, cigarettes, uncomplicated: Secondary | ICD-10-CM | POA: Diagnosis not present

## 2023-08-17 DIAGNOSIS — R519 Headache, unspecified: Secondary | ICD-10-CM | POA: Insufficient documentation

## 2023-08-17 DIAGNOSIS — R079 Chest pain, unspecified: Secondary | ICD-10-CM | POA: Diagnosis not present

## 2023-08-17 DIAGNOSIS — R109 Unspecified abdominal pain: Secondary | ICD-10-CM | POA: Insufficient documentation

## 2023-08-17 DIAGNOSIS — M25571 Pain in right ankle and joints of right foot: Secondary | ICD-10-CM | POA: Diagnosis not present

## 2023-08-17 LAB — COMPREHENSIVE METABOLIC PANEL WITH GFR
ALT: 31 U/L (ref 0–44)
AST: 62 U/L — ABNORMAL HIGH (ref 15–41)
Albumin: 4.6 g/dL (ref 3.5–5.0)
Alkaline Phosphatase: 66 U/L (ref 38–126)
Anion gap: 14 (ref 5–15)
BUN: 18 mg/dL (ref 6–20)
CO2: 18 mmol/L — ABNORMAL LOW (ref 22–32)
Calcium: 10 mg/dL (ref 8.9–10.3)
Chloride: 109 mmol/L (ref 98–111)
Creatinine, Ser: 1.2 mg/dL (ref 0.61–1.24)
GFR, Estimated: >60 mL/min (ref >60)
Glucose, Bld: 92 mg/dL (ref 70–99)
Potassium: 3.7 mmol/L (ref 3.5–5.1)
Sodium: 141 mmol/L (ref 135–145)
Total Bilirubin: 0.5 mg/dL (ref 0.0–1.2)
Total Protein: 7.6 g/dL (ref 6.5–8.1)

## 2023-08-17 LAB — CBC
HCT: 44.8 % (ref 39.0–52.0)
Hemoglobin: 14.4 g/dL (ref 13.0–17.0)
MCH: 26.1 pg (ref 26.0–34.0)
MCHC: 32.1 g/dL (ref 30.0–36.0)
MCV: 81.2 fL (ref 80.0–100.0)
Platelets: 266 K/uL (ref 150–400)
RBC: 5.52 MIL/uL (ref 4.22–5.81)
RDW: 13.4 % (ref 11.5–15.5)
WBC: 16.2 K/uL — ABNORMAL HIGH (ref 4.0–10.5)
nRBC: 0 % (ref 0.0–0.2)

## 2023-08-17 MED ORDER — IOHEXOL 300 MG/ML  SOLN
100.0000 mL | Freq: Once | INTRAMUSCULAR | Status: AC | PRN
  Administered 2023-08-17: 100 mL via INTRAVENOUS

## 2023-08-17 MED ORDER — SODIUM CHLORIDE 0.9 % IV BOLUS
1000.0000 mL | Freq: Once | INTRAVENOUS | Status: AC
  Administered 2023-08-17: 1000 mL via INTRAVENOUS

## 2023-08-17 MED ORDER — OXYCODONE-ACETAMINOPHEN 5-325 MG PO TABS: 1.0000 | ORAL_TABLET | Freq: Four times a day (QID) | ORAL | 0 refills | Status: AC | PRN

## 2023-08-17 MED ORDER — HYDROMORPHONE HCL 1 MG/ML IJ SOLN
1.0000 mg | Freq: Once | INTRAMUSCULAR | Status: AC
  Administered 2023-08-17: 1 mg via INTRAVENOUS
  Filled 2023-08-17: qty 1
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
## 2023-08-17 NOTE — Discharge Instructions (Addendum)
 Take Tylenol  alternate with ibuprofen  for pain.  We are prescribing you breakthrough pain medications.  Return if you have fevers, chills, chest pain, shortness of breath, passout, severe abdominal pain, nausea vomiting or any new or worsening symptoms that are concerning to you.

## 2023-08-17 NOTE — ED Provider Notes (Signed)
 Received signout; see overnight team's note for full HPI.  Signed out pending x-ray of the foot.  Nursing staff informing that patient wanted to speak to me.  He would like to leave the hospital.  I explained to the patient that we are waiting on x-ray to evaluate for possible fracture.  He does not want to wait for the x-ray.  I explained the risks of missed fracture to include increased pain, permanent disability, poor healing.  Patient voiced understanding.  Clinically sober and has capacity in my clinical opinion.  Will forego x-ray and discharge patient request.  Placed in Ace wrap, postop shoe and given crutches.  Patient instructed to follow-up with Ortho.  Will discharge at patient's request.   Neysa Caron PARAS, DO 08/17/23 9191
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
## 2023-10-03 ENCOUNTER — Ambulatory Visit (INDEPENDENT_AMBULATORY_CARE_PROVIDER_SITE_OTHER)

## 2023-10-03 ENCOUNTER — Ambulatory Visit: Admission: EM | Admit: 2023-10-03 | Discharge: 2023-10-03 | Disposition: A | Discharge status: Home / Self Care

## 2023-10-03 DIAGNOSIS — M79671 Pain in right foot: Secondary | ICD-10-CM

## 2023-10-03 MED ORDER — NAPROXEN 500 MG PO TABS: 500.0000 mg | ORAL_TABLET | Freq: Two times a day (BID) | ORAL | 0 refills | Status: AC
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
## 2023-10-03 NOTE — Discharge Instructions (Signed)
  Please follow up with ortho as soon as possible.  
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
